# Patient Record
Sex: Male | Born: 1960 | Race: Black or African American | Hispanic: No | Marital: Married | Smoking: Never smoker
Health system: Southern US, Community
[De-identification: ages and names within clinical notes are randomized; demographics above are authoritative.]

## PROBLEM LIST (undated history)

## (undated) DIAGNOSIS — B029 Zoster without complications: Secondary | ICD-10-CM

## (undated) DIAGNOSIS — R0981 Nasal congestion: Secondary | ICD-10-CM

## (undated) DIAGNOSIS — T7840XA Allergy, unspecified, initial encounter: Secondary | ICD-10-CM

## (undated) HISTORY — PX: NO PAST SURGERIES: SHX2092

## (undated) HISTORY — DX: Allergy, unspecified, initial encounter: T78.40XA

---

## 2008-09-22 ENCOUNTER — Emergency Department (HOSPITAL_COMMUNITY): Admission: EM | Admit: 2008-09-22 | Discharge: 2008-09-22 | Payer: Self-pay | Admitting: Emergency Medicine

## 2009-03-10 ENCOUNTER — Emergency Department (HOSPITAL_COMMUNITY): Admission: EM | Admit: 2009-03-10 | Discharge: 2009-03-10 | Payer: Self-pay | Admitting: Emergency Medicine

## 2009-05-17 ENCOUNTER — Emergency Department (HOSPITAL_COMMUNITY): Admission: EM | Admit: 2009-05-17 | Discharge: 2009-05-17 | Payer: Self-pay | Admitting: Internal Medicine

## 2009-05-20 ENCOUNTER — Emergency Department (HOSPITAL_COMMUNITY): Admission: EM | Admit: 2009-05-20 | Discharge: 2009-05-20 | Payer: Self-pay | Admitting: Emergency Medicine

## 2009-06-02 ENCOUNTER — Emergency Department (HOSPITAL_COMMUNITY): Admission: EM | Admit: 2009-06-02 | Discharge: 2009-06-02 | Payer: Self-pay | Admitting: Emergency Medicine

## 2009-08-22 ENCOUNTER — Emergency Department (HOSPITAL_COMMUNITY): Admission: EM | Admit: 2009-08-22 | Discharge: 2009-08-22 | Payer: Self-pay | Admitting: Emergency Medicine

## 2009-12-23 ENCOUNTER — Emergency Department (HOSPITAL_COMMUNITY): Admission: EM | Admit: 2009-12-23 | Discharge: 2009-12-23 | Payer: Self-pay | Admitting: Emergency Medicine

## 2010-02-19 ENCOUNTER — Emergency Department: Admission: EM | Admit: 2010-02-19 | Discharge: 2010-02-19 | Payer: Self-pay | Admitting: Emergency Medicine

## 2010-11-29 ENCOUNTER — Emergency Department (HOSPITAL_COMMUNITY)
Admission: EM | Admit: 2010-11-29 | Discharge: 2010-11-29 | Payer: Self-pay | Source: Home / Self Care | Admitting: Emergency Medicine

## 2011-03-09 LAB — URINALYSIS, ROUTINE W REFLEX MICROSCOPIC
Hgb urine dipstick: NEGATIVE
Ketones, ur: NEGATIVE mg/dL
Specific Gravity, Urine: 1.019 (ref 1.005–1.030)

## 2011-06-19 ENCOUNTER — Emergency Department (HOSPITAL_COMMUNITY)
Admission: EM | Admit: 2011-06-19 | Discharge: 2011-06-19 | Disposition: A | Discharge status: Home / Self Care | Payer: Self-pay | Attending: Emergency Medicine | Admitting: Emergency Medicine

## 2011-06-19 DIAGNOSIS — J329 Chronic sinusitis, unspecified: Secondary | ICD-10-CM | POA: Insufficient documentation

## 2011-06-19 DIAGNOSIS — J3489 Other specified disorders of nose and nasal sinuses: Secondary | ICD-10-CM | POA: Insufficient documentation

## 2011-10-31 ENCOUNTER — Emergency Department (HOSPITAL_COMMUNITY)
Admission: EM | Admit: 2011-10-31 | Discharge: 2011-10-31 | Disposition: A | Discharge status: Home / Self Care | Payer: Self-pay | Attending: Emergency Medicine | Admitting: Emergency Medicine

## 2011-10-31 DIAGNOSIS — J329 Chronic sinusitis, unspecified: Secondary | ICD-10-CM | POA: Insufficient documentation

## 2011-10-31 DIAGNOSIS — J309 Allergic rhinitis, unspecified: Secondary | ICD-10-CM | POA: Insufficient documentation

## 2011-10-31 DIAGNOSIS — J302 Other seasonal allergic rhinitis: Secondary | ICD-10-CM | POA: Diagnosis present

## 2011-10-31 HISTORY — DX: Nasal congestion: R09.81

## 2011-10-31 MED ORDER — AMOXICILLIN 500 MG PO CAPS: 1000.0000 mg | ORAL_CAPSULE | Freq: Three times a day (TID) | ORAL | Status: AC

## 2011-10-31 MED ORDER — LORATADINE 10 MG PO TABS: 10.0000 mg | ORAL_TABLET | Freq: Every day | ORAL | Status: DC

## 2011-10-31 NOTE — ED Provider Notes (Signed)
History     CSN: [id number] Arrival date & time: 10/31/2011  7:58 PM   First MD Initiated Contact with Patient 10/31/11 2309      Chief Complaint  Patient presents with  . Nasal Congestion  . Sinusitis    (Consider location/radiation/quality/duration/timing/severity/associated sxs/prior treatment) HPI Comments: Patient reports 4 weeks of nasal congestion, postanasla drip, rhinorrhea, brown discharge, cough, sinus pain and headache.  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  Denies known fevers.  Has been taking over the counter allergy medication without relief.    The history is provided by the patient.    Past Medical History  Diagnosis Date  . Sinus congestion     No past surgical history on file.  History reviewed. No pertinent family history.  History  Substance Use Topics  . Smoking status: Never Smoker   . Smokeless tobacco: Not on file  . Alcohol Use: No      Review of Systems  All other systems reviewed and are negative.    Allergies  Review of patient's allergies indicates no known allergies.  Home Medications  No current outpatient prescriptions on file.  BP 145/95  Pulse 67  Temp(Src) 98.9 F (37.2 C) (Oral)  Resp 20  Ht 5\' 7"  (1.702 m)  Wt 190 lb (86.183 kg)  BMI 29.76 kg/m2  SpO2 100%  Physical Exam  Nursing note and vitals reviewed. Constitutional: He is oriented to person, place, and time. He appears well-developed and well-nourished.  HENT:  Head: Normocephalic and atraumatic.  Right Ear: Tympanic membrane is scarred. Tympanic membrane is not erythematous, not retracted and not bulging.  Left Ear: External ear normal.  Nose: Mucosal edema and rhinorrhea present. Right sinus exhibits maxillary sinus tenderness. Right sinus exhibits no frontal sinus tenderness. Left sinus exhibits maxillary sinus tenderness. Left sinus exhibits no frontal sinus tenderness.  Mouth/Throat:  Uvula is midline and oropharynx is clear and moist. No uvula swelling. No oropharyngeal exudate, posterior oropharyngeal edema or tonsillar abscesses.  Neck: Trachea normal, normal range of motion and phonation normal. Neck supple. No tracheal tenderness present. No rigidity. No tracheal deviation and normal range of motion present.       Supple, full AROM  Cardiovascular: Normal rate and regular rhythm.   Pulmonary/Chest: Effort normal and breath sounds normal. No stridor. He has no wheezes.  Neurological: He is alert and oriented to person, place, and time.    ED Course  Procedures (including critical care time)  Labs Reviewed - No data to display No results found.   1. Sinusitis   2. Seasonal allergies       MDM  Patient with environmental allergies with recurrent sinus infections, sinus pain, brown discharge, tender maxillary sinuses.         Dillard Cannon [place], [place] 10/31/11 2333

## 2011-10-31 NOTE — ED Notes (Signed)
Pt states he has had sinus drainage, bilateral ear pain, and sore throat for the past month. States that he has taken OTC allergy medicine with no relief. Pt states that his head hurts and ears pop when he coughs. Pt reports nonproductive cough at this time. Pt denies fever, nausea, vomiting.

## 2011-11-01 NOTE — ED Provider Notes (Signed)
Medical screening examination/treatment/procedure(s) were performed by non-physician practitioner and as supervising physician I was immediately available for consultation/collaboration.  Olivia Mackie, MD 11/01/11 [phone]

## 2012-01-28 ENCOUNTER — Emergency Department (HOSPITAL_COMMUNITY)
Admission: EM | Admit: 2012-01-28 | Discharge: 2012-01-28 | Disposition: A | Discharge status: Home / Self Care | Payer: Self-pay | Attending: Emergency Medicine | Admitting: Emergency Medicine

## 2012-01-28 ENCOUNTER — Encounter (HOSPITAL_COMMUNITY): Payer: Self-pay | Admitting: *Deleted

## 2012-01-28 DIAGNOSIS — J019 Acute sinusitis, unspecified: Secondary | ICD-10-CM | POA: Insufficient documentation

## 2012-01-28 DIAGNOSIS — R059 Cough, unspecified: Secondary | ICD-10-CM | POA: Insufficient documentation

## 2012-01-28 DIAGNOSIS — J069 Acute upper respiratory infection, unspecified: Secondary | ICD-10-CM | POA: Insufficient documentation

## 2012-01-28 DIAGNOSIS — J3489 Other specified disorders of nose and nasal sinuses: Secondary | ICD-10-CM | POA: Insufficient documentation

## 2012-01-28 DIAGNOSIS — R0982 Postnasal drip: Secondary | ICD-10-CM | POA: Insufficient documentation

## 2012-01-28 DIAGNOSIS — R220 Localized swelling, mass and lump, head: Secondary | ICD-10-CM | POA: Insufficient documentation

## 2012-01-28 DIAGNOSIS — R05 Cough: Secondary | ICD-10-CM | POA: Insufficient documentation

## 2012-01-28 MED ORDER — AZITHROMYCIN 250 MG PO TABS: 250.0000 mg | ORAL_TABLET | Freq: Every day | ORAL | Status: AC

## 2012-01-28 NOTE — ED Notes (Signed)
Pt states he has had a cold for 3 weeks. Pt states he is now having a productive cough and nasal drainage. Pt denies any chest pain . 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

## 2012-01-28 NOTE — Progress Notes (Signed)
Pt confirms no pcp self pay [place] resident CM offered number for [facility] and other local self pay pcps plus other [place] resources.  Pt appreciative of resources offered. Voiced understanding

## 2012-01-28 NOTE — ED Provider Notes (Signed)
History     CSN: [id number]  Arrival date & time 01/28/12  1652   First MD Initiated Contact with Patient 01/28/12 1730      Chief Complaint  Patient presents with  . URI  . Sinusitis    (Consider location/radiation/quality/duration/timing/severity/associated sxs/prior treatment) Patient is a 51 y.o. male presenting with sinusitis. The history is provided by the patient.  Sinusitis  This is a new problem. Episode onset: three weeks. The problem has not changed since onset.There has been no fever. Associated symptoms include congestion, sinus pressure and cough. Pertinent negatives include no chills, no ear pain, no hoarse voice, no sore throat and no shortness of breath.    Past Medical History  Diagnosis Date  . Sinus congestion     History reviewed. No pertinent past surgical history.  No family history on file.  History  Substance Use Topics  . Smoking status: Never Smoker   . Smokeless tobacco: Not on file  . Alcohol Use: No      Review of Systems  Constitutional: Negative for fever and chills.  HENT: Positive for congestion, postnasal drip and sinus pressure. Negative for ear pain, sore throat, hoarse voice, neck pain and neck stiffness.   Respiratory: Positive for cough. Negative for chest tightness and shortness of breath.   Cardiovascular: Negative for chest pain.  Gastrointestinal: Negative for nausea and vomiting.  Neurological: Negative for dizziness, syncope and light-headedness.    Allergies  Review of patient's allergies indicates no known allergies.  Home Medications   Current Outpatient Rx  Name Route Sig Dispense Refill  . OVER THE COUNTER MEDICATION Oral Take 1-2 tablets by mouth daily. OTC Severe Allergy medication from [facility].    Marland Kitchen OVER THE COUNTER MEDICATION Nasal Place 1 spray into the nose 2 (two) times daily. OTC nasal allergy spray from [facility].      BP 125/90  Pulse 77  Temp(Src) 98.7 F (37.1 C) (Oral)  Resp 22  Ht 5\' 7"   (1.702 m)  Wt 189 lb (85.73 kg)  BMI 29.60 kg/m2  SpO2 100%  Physical Exam  Nursing note and vitals reviewed. Constitutional: He is oriented to person, place, and time. He appears well-developed and well-nourished. No distress.  HENT:  Head: Normocephalic and atraumatic.  Right Ear: Tympanic membrane and ear canal normal.  Left Ear: Tympanic membrane and ear canal normal.  Nose: Mucosal edema and rhinorrhea present. Right sinus exhibits frontal sinus tenderness. Right sinus exhibits no maxillary sinus tenderness. Left sinus exhibits frontal sinus tenderness. Left sinus exhibits no maxillary sinus tenderness.  Mouth/Throat: Uvula is midline, oropharynx is clear and moist and mucous membranes are normal.  Neck: Normal range of motion. Neck supple.  Cardiovascular: Normal rate, regular rhythm and normal heart sounds.   Pulmonary/Chest: Effort normal and breath sounds normal. No respiratory distress. He has no wheezes. He has no rales. He exhibits no tenderness.  Musculoskeletal: Normal range of motion.  Neurological: He is alert and oriented to person, place, and time.  Skin: Skin is warm and dry. He is not diaphoretic.  Psychiatric: He has a normal mood and affect.    ED Course  Procedures (including critical care time)  Labs Reviewed - No data to display No results found.   No diagnosis found.    MDM  Signs and symptoms consistent with Acute Sinusitis.  Patient given Rx for antibiotic and instructed to use OTC decongestant.          Pascal Lux Enoch, PA-C 01/29/12 [phone]

## 2012-01-28 NOTE — Discharge Instructions (Signed)
Read the instructions below on reasons to return to the emergency department and to learn more about your diagnosis.  Use over the counter medications for symptomatic relief as we discussed (mucinex as a decongestant, Tylenol for fever/pain, Motrin/Ibuprofen for muscle aches). Followup with your primary care doctor in 4 days if your symptoms persist.  Your more than welcome to return to the emergency department if symptoms worsen or become concerning. ° °Upper Respiratory Infection, Adult  °An upper respiratory infection (URI) is also sometimes known as the common cold. Most people improve within 1 week, but symptoms can last up to 2 weeks. A residual cough may last even longer.  ° °URI is most commonly caused by a virus. Viruses are NOT treated with antibiotics. You can easily spread the virus to others by oral contact. This includes kissing, sharing a glass, coughing, or sneezing. Touching your mouth or nose and then touching a surface, which is then touched by another person, can also spread the virus.  ° °TREATMENT  °Treatment is directed at relieving symptoms. There is no cure. Antibiotics are not effective, because the infection is caused by a virus, not by bacteria. Treatment may include:  °Increased fluid intake. Sports drinks offer valuable electrolytes, sugars, and fluids.  °Breathing heated mist or steam (vaporizer or shower).  °Eating chicken soup or other clear broths, and maintaining good nutrition.  °Getting plenty of rest.  °Using gargles or lozenges for comfort.  °Controlling fevers with ibuprofen or acetaminophen as directed by your caregiver.  °Increasing usage of your inhaler if you have asthma.  °Return to work when your temperature has returned to normal.  ° °SEEK MEDICAL CARE IF:  °After the first few days, you feel you are getting worse rather than better.  °You develop worsening shortness of breath, or brown or red sputum. These may be signs of pneumonia.  °You develop yellow or brown nasal  discharge or pain in the face, especially when you bend forward. These may be signs of sinusitis.  °You develop a fever, swollen neck glands, pain with swallowing, or white areas in the back of your throat. These may be signs of strep throat.  ° °

## 2012-01-30 NOTE — ED Provider Notes (Signed)
Medical screening examination/treatment/procedure(s) were performed by non-physician practitioner and as supervising physician I was immediately available for consultation/collaboration. Maisie Hauser,Yohan Y.   Gavin Pound. Oletta Lamas, MD 01/30/12 [phone]

## 2012-09-25 ENCOUNTER — Emergency Department (HOSPITAL_COMMUNITY)
Admission: EM | Admit: 2012-09-25 | Discharge: 2012-09-25 | Disposition: A | Discharge status: Home / Self Care | Payer: Self-pay | Attending: Emergency Medicine | Admitting: Emergency Medicine

## 2012-09-25 ENCOUNTER — Emergency Department (HOSPITAL_COMMUNITY): Payer: Self-pay

## 2012-09-25 ENCOUNTER — Encounter (HOSPITAL_COMMUNITY): Payer: Self-pay | Admitting: *Deleted

## 2012-09-25 DIAGNOSIS — J329 Chronic sinusitis, unspecified: Secondary | ICD-10-CM | POA: Insufficient documentation

## 2012-09-25 DIAGNOSIS — R51 Headache: Secondary | ICD-10-CM | POA: Insufficient documentation

## 2012-09-25 DIAGNOSIS — Z79899 Other long term (current) drug therapy: Secondary | ICD-10-CM | POA: Insufficient documentation

## 2012-09-25 MED ORDER — AMOXICILLIN 500 MG PO CAPS: 500.0000 mg | ORAL_CAPSULE | Freq: Three times a day (TID) | ORAL | Status: DC

## 2012-09-25 MED ORDER — IBUPROFEN 800 MG PO TABS
800.0000 mg | ORAL_TABLET | Freq: Once | ORAL | Status: AC
  Administered 2012-09-25: 800 mg via ORAL
  Filled 2012-09-25: qty 1

## 2012-09-25 MED ORDER — FLUTICASONE PROPIONATE 50 MCG/ACT NA SUSP: 2.0000 | Freq: Every day | NASAL | Status: DC

## 2012-09-25 NOTE — Progress Notes (Signed)
Pt with no pcp nor coverage as confirmed by pt CM spoke with pt to review list of self pay pcps to further assist her with prescriptions and health care.  Discussed discounted pharmacies, DSS, health dept, needymeds.org and financial assistance programs in [place].  Pt voiced understanding and appreciation of resources and services offered 

## 2012-09-25 NOTE — ED Provider Notes (Signed)
Medical screening examination/treatment/procedure(s) were conducted as a shared visit with non-physician practitioner(s) and myself.  I personally evaluated the patient during the encounter  Donnetta Hutching, MD 09/25/12 2314

## 2012-09-25 NOTE — ED Provider Notes (Signed)
History     CSN: [id number]  Arrival date & time 09/25/12  1459   First MD Initiated Contact with Patient 09/25/12 1524      Chief Complaint  Patient presents with  . Nasal Congestion  . Headache    (Consider location/radiation/quality/duration/timing/severity/associated sxs/prior treatment) Patient is a 51 y.o. male presenting with URI. The history is provided by the patient.  URI The primary symptoms include fatigue, headaches, ear pain, sore throat, cough and myalgias. Primary symptoms do not include fever, swollen glands, abdominal pain, nausea, vomiting or rash. Episode onset: 3 weeks ago. This is a new problem. The problem has not changed since onset. The headache is not associated with neck stiffness.  Symptoms associated with the illness include chills, plugged ear sensation, facial pain, sinus pressure and congestion.  Pt states feels like prior sinus infection. States also having cough with thick white sputum. States tired equate brand congestion medication with no relief. Denies any fever, neck pain or stiffness, eye pain or pressure, chest pain, swelling of extremities. No PCP  Past Medical History  Diagnosis Date  . Sinus congestion     History reviewed. No pertinent past surgical history.  No family history on file.  History  Substance Use Topics  . Smoking status: Never Smoker   . Smokeless tobacco: Not on file  . Alcohol Use: No      Review of Systems  Constitutional: Positive for chills and fatigue. Negative for fever.  HENT: Positive for ear pain, congestion, sore throat and sinus pressure. Negative for neck pain, neck stiffness and dental problem.   Respiratory: Positive for cough. Negative for chest tightness.   Cardiovascular: Negative for chest pain, palpitations and leg swelling.  Gastrointestinal: Negative for nausea, vomiting and abdominal pain.  Musculoskeletal: Positive for myalgias.  Skin: Negative for rash.  Neurological: Positive for  headaches.  Hematological: Negative for adenopathy.    Allergies  Review of patient's allergies indicates no known allergies.  Home Medications   Current Outpatient Rx  Name Route Sig Dispense Refill  . ADULT MULTIVITAMIN W/MINERALS CH Oral Take 1 tablet by mouth daily.    Marland Kitchen OVER THE COUNTER MEDICATION Oral Take 1-2 tablets by mouth daily. OTC Severe Allergy medication from [facility].      BP 127/85  Pulse 80  Temp 98.5 F (36.9 C) (Oral)  Resp 16  Ht 5\' 7"  (1.702 m)  Wt 186 lb (84.369 kg)  BMI 29.13 kg/m2  SpO2 99%  Physical Exam  Nursing note and vitals reviewed. Constitutional: He is oriented to person, place, and time. He appears well-developed and well-nourished. No distress.  HENT:  Head: Normocephalic and atraumatic.  Right Ear: Tympanic membrane, external ear and ear canal normal.  Left Ear: Tympanic membrane, external ear and ear canal normal.  Nose: Rhinorrhea present. Right sinus exhibits maxillary sinus tenderness and frontal sinus tenderness. Left sinus exhibits maxillary sinus tenderness and frontal sinus tenderness.  Mouth/Throat: Uvula is midline, oropharynx is clear and moist and mucous membranes are normal.  Eyes: Conjunctivae normal are normal.  Neck: Neck supple.  Cardiovascular: Normal rate, regular rhythm and normal heart sounds.   Pulmonary/Chest: Effort normal and breath sounds normal. No respiratory distress. He has no wheezes. He has no rales.  Musculoskeletal: He exhibits no edema.  Neurological: He is alert and oriented to person, place, and time.  Psychiatric: He has a normal mood and affect.    ED Course  Procedures (including critical care time)  Labs Reviewed - No data  to display Dg Chest 2 View  09/25/2012  *RADIOLOGY REPORT*  Clinical Data: Cough, nasal congestion, headache  CHEST - 2 VIEW  Comparison: None  Findings: Normal heart size. Upper normal size of central pulmonary arteries, stable. Tortuous aorta. Lungs clear. No pleural  effusion or pneumothorax. Bones unremarkable.  IMPRESSION: No acute abnormalities.   Original Report Authenticated By: Lollie Marrow, M.D.    Negative chest. Pt has had sinus symptoms for 3 wks, exam consistent with sinusitis. Will start on antibiotic, decongestants, flonse. Follow up resources given.   Filed Vitals:   09/25/12 1511  BP: 127/85  Pulse: 80  Temp: 98.5 F (36.9 C)  Resp: 16    1. Sinusitis       MDM          Lottie Mussel, PA 09/25/12 1640

## 2012-09-25 NOTE — ED Notes (Signed)
Pt reports congestion, productive cough of clear sputum, ear pressure, head pressure. States that he has taken otc meds for symptoms with no relief. Pt is afebrile at this time and no cough is present.

## 2013-08-23 ENCOUNTER — Emergency Department (HOSPITAL_COMMUNITY)
Admission: EM | Admit: 2013-08-23 | Discharge: 2013-08-23 | Disposition: A | Discharge status: Home / Self Care | Payer: Self-pay | Attending: Emergency Medicine | Admitting: Emergency Medicine

## 2013-08-23 ENCOUNTER — Encounter (HOSPITAL_COMMUNITY): Payer: Self-pay | Admitting: Emergency Medicine

## 2013-08-23 DIAGNOSIS — H5710 Ocular pain, unspecified eye: Secondary | ICD-10-CM | POA: Insufficient documentation

## 2013-08-23 DIAGNOSIS — J302 Other seasonal allergic rhinitis: Secondary | ICD-10-CM

## 2013-08-23 DIAGNOSIS — J329 Chronic sinusitis, unspecified: Secondary | ICD-10-CM

## 2013-08-23 DIAGNOSIS — J309 Allergic rhinitis, unspecified: Secondary | ICD-10-CM | POA: Insufficient documentation

## 2013-08-23 DIAGNOSIS — H9209 Otalgia, unspecified ear: Secondary | ICD-10-CM | POA: Insufficient documentation

## 2013-08-23 MED ORDER — PENICILLIN V POTASSIUM 500 MG PO TABS
500.0000 mg | ORAL_TABLET | Freq: Once | ORAL | Status: AC
  Administered 2013-08-23: 500 mg via ORAL
  Filled 2013-08-23: qty 1

## 2013-08-23 MED ORDER — IBUPROFEN 800 MG PO TABS
800.0000 mg | ORAL_TABLET | Freq: Once | ORAL | Status: AC
  Administered 2013-08-23: 800 mg via ORAL
  Filled 2013-08-23: qty 1

## 2013-08-23 MED ORDER — PENICILLIN V POTASSIUM 500 MG PO TABS: 500.0000 mg | ORAL_TABLET | Freq: Three times a day (TID) | ORAL | Status: DC

## 2013-08-23 NOTE — ED Provider Notes (Signed)
Medical screening examination/treatment/procedure(s) were performed by non-physician practitioner and as supervising physician I was immediately available for consultation/collaboration.   Charles B. Bernette Mayers, MD 08/23/13 [phone]

## 2013-08-23 NOTE — ED Provider Notes (Signed)
CSN: [id number]     Arrival date & time 08/23/13  1716 History  This chart was scribed for Marlon Pel, PA, working with Leonette Most B. Bernette Mayers, MD by Blanchard Kelch, ED Scribe. This patient was seen in room WTR6/WTR6 and the patient's care was started at 6:20 PM.    Chief Complaint  Patient presents with  . Allergies    The history is provided by the patient. No language interpreter was used.    HPI Comments: KEYSHUN ELPERS is a 52 y.o. male with a history of seasonal allergies who presents to the Emergency Department complaining of constant congestion that began about a month ago. He has associated eye pain described as burning, ear pain, sinus pressure and headache. He has been taking OTC medications for his allergies without relief. He denies cough, sneezing or wheezing. He denies being allergic to any antibiotics that he knows of.  Past Medical History  Diagnosis Date  . Sinus congestion    History reviewed. No pertinent past surgical history. No family history on file. History  Substance Use Topics  . Smoking status: Never Smoker   . Smokeless tobacco: Not on file  . Alcohol Use: No    Review of Systems  HENT: Positive for ear pain, congestion and sinus pressure. Negative for sneezing.   Eyes: Positive for pain.  Respiratory: Negative for cough.   Allergic/Immunologic: Positive for environmental allergies.  Neurological: Positive for headaches.  All other systems reviewed and are negative.    Allergies  Review of patient's allergies indicates no known allergies.  Home Medications   Current Outpatient Rx  Name  Route  Sig  Dispense  Refill  . diphenhydrAMINE (BENADRYL) 25 MG tablet   Oral   Take 25 mg by mouth every 6 (six) hours as needed for itching or allergies.         Marland Kitchen penicillin v potassium (VEETID) 500 MG tablet   Oral   Take 1 tablet (500 mg total) by mouth 3 (three) times daily.   30 tablet   0    Triage Vitals: BP 141/102  Pulse 90  Temp(Src)  98.7 F (37.1 C)  Resp 18  SpO2 100%  Physical Exam  Nursing note and vitals reviewed. Constitutional: He is oriented to person, place, and time. He appears well-developed and well-nourished. No distress.  HENT:  Head: Normocephalic and atraumatic.  Right Ear: Tympanic membrane and ear canal normal.  Left Ear: Tympanic membrane and ear canal normal.  Nose: Rhinorrhea and sinus tenderness present. Right sinus exhibits maxillary sinus tenderness and frontal sinus tenderness. Left sinus exhibits maxillary sinus tenderness and frontal sinus tenderness.  Mouth/Throat: Uvula is midline and oropharynx is clear and moist.  Eyes: EOM are normal.  Neck: Neck supple. No tracheal deviation present.  Cardiovascular: Normal rate.   Pulmonary/Chest: Effort normal. No respiratory distress.  Musculoskeletal: Normal range of motion.  Neurological: He is alert and oriented to person, place, and time.  Skin: Skin is warm and dry.  Psychiatric: He has a normal mood and affect. His behavior is normal.    ED Course  Procedures (including critical care time)  DIAGNOSTIC STUDIES: Oxygen Saturation is 100% on room air, normal by my interpretation.    COORDINATION OF CARE:  6:24 PM -Suspect infection due to congestion. Will prescribe Penicillin. Recommend taking Alieve for the headache. Patient verbalizes understanding and agrees with treatment plan.   Labs Review Labs Reviewed - No data to display Imaging Review No results found.  MDM  1. Sinusitis   2. Seasonal allergies     51 y.o.Veverly Fells Janusz's evaluation in the Emergency Department is complete. It has been determined that no acute conditions requiring further emergency intervention are present at this time. The patient/guardian have been advised of the diagnosis and plan. We have discussed signs and symptoms that warrant return to the ED, such as changes or worsening in symptoms.  Vital signs are stable at discharge. Filed Vitals:    08/23/13 1733  BP: 141/102  Pulse: 90  Temp: 98.7 F (37.1 C)  Resp: 18    Patient/guardian has voiced understanding and agreed to follow-up with the PCP or specialist.  I personally performed the services described in this documentation, which was scribed in my presence. The recorded information has been reviewed and is accurate.    Dorthula Matas, PA-C 08/23/13 1830

## 2013-08-23 NOTE — ED Notes (Signed)
Per pt, has been having seasonal allergies for over a month, no relief with OTC meds

## 2013-09-18 ENCOUNTER — Ambulatory Visit: Payer: Self-pay | Admitting: Internal Medicine

## 2013-09-18 VITALS — BP 130/80 | HR 77 | Temp 97.9°F | Resp 16 | Ht 68.0 in | Wt 186.0 lb

## 2013-09-18 DIAGNOSIS — Z0289 Encounter for other administrative examinations: Secondary | ICD-10-CM

## 2013-09-18 NOTE — Progress Notes (Signed)
U/A Sp. Gr. 1.005  Protein- neg  Blood- Neg  Glucose- Neg

## 2013-09-18 NOTE — Progress Notes (Signed)
°  Subjective:    Patient ID: Ricardo [name], male    DOB: 08-03-1961, 52 y.o.   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 Patient says that overall he has been healthy this year. He has seasonal allergies and takes an OTC medication for them; otherwise he takes no other medications. Doesn't have a primary physician, he normally just goes to an urgent care or ER.    Review of Systems     Objective:   Physical Exam        Assessment & Plan:

## 2013-09-18 NOTE — Progress Notes (Signed)
  Subjective:    Patient ID: Ricardo [name], male    DOB: 02/17/1961, 52 y.o.   MRN: [id number]  HPI Self pay dot. No health problems   Review of Systems  Constitutional: Negative.   HENT: Negative.   Eyes: Negative.   Respiratory: Negative.   Cardiovascular: Negative.   Gastrointestinal: Negative.   Endocrine: Negative.   Genitourinary: Negative.   Musculoskeletal: Negative.   Skin: Positive for rash.  Allergic/Immunologic: Positive for environmental allergies and food allergies.  Neurological: Negative.   Hematological: Negative.   Psychiatric/Behavioral: Negative.        Objective:   Physical Exam  Vitals reviewed. Constitutional: He is oriented to person, place, and time. He appears well-developed and well-nourished.  HENT:  Right Ear: External ear normal.  Left Ear: External ear normal.  Nose: Nose normal.  Mouth/Throat: Oropharynx is clear and moist.  Eyes: Conjunctivae and EOM are normal. Pupils are equal, round, and reactive to light.  Neck: Normal range of motion. Neck supple. No tracheal deviation present. No thyromegaly present.  Cardiovascular: Normal rate, regular rhythm and normal heart sounds.   Pulmonary/Chest: Effort normal and breath sounds normal.  Abdominal: Soft. Bowel sounds are normal. He exhibits no mass. There is no tenderness.  Genitourinary: Penis normal.  Musculoskeletal: Normal range of motion.  Neurological: He is alert and oriented to person, place, and time. He has normal reflexes. No cranial nerve deficit. He exhibits normal muscle tone. Coordination normal.  Skin: Rash noted.  Psychiatric: He has a normal mood and affect. His behavior is normal. Thought content normal.          Assessment & Plan:  DOT 2 year

## 2013-09-18 NOTE — Patient Instructions (Signed)
Food Allergy A food allergy occurs from eating something you are sensitive to. Food allergies occur in all age groups. It may be passed to you from your parents (heredity).  CAUSES  Some common causes are cow's milk, seafood, eggs, nuts (including peanut butter), wheat, and soybeans. SYMPTOMS  Common problems are:   Swelling around the mouth.  An itchy, red rash.  Hives.  Vomiting.  Diarrhea. Severe allergic reactions are life-threatening. This reaction is called anaphylaxis. It can cause the mouth and throat to swell. This makes it hard to breathe and swallow. In severe reactions, only a small amount of food may be fatal within seconds. HOME CARE INSTRUCTIONS   If you are unsure what caused the reaction, keep a diary of foods eaten and symptoms that followed. Avoid foods that cause reactions.  If hives or rash are present:  Take medicines as directed.  Use an over-the-counter antihistamine (diphenhydramine) to treat hives and itching as needed.  Apply cold compresses to the skin or take baths in cool water. Avoid hot baths or showers. These will increase the redness and itching.  If you are severely allergic:  Hospitalization is often required following a severe reaction.  Wear a medical alert bracelet or necklace that describes the allergy.  Carry your anaphylaxis kit or epinephrine injection with you at all times. Both you and your family members should know how to use this. This can be lifesaving if you have a severe reaction. If epinephrine is used, it is important for you to seek immediate medical care or call your local emergency services (911 in [place] When the epinephrine wears off, it can be followed by a delayed reaction, which can be fatal.  Replace your epinephrine immediately after use in case of another reaction.  Ask your caregiver for instructions if you have not been taught how to use an epinephrine injection.  Do not drive until medicines used to treat the  reaction have worn off, unless approved by your caregiver. SEEK MEDICAL CARE IF:   You suspect a food allergy. Symptoms generally happen within 30 minutes of eating a food.  Your symptoms have not gone away within 2 days. See your caregiver sooner if symptoms are getting worse.  You develop new symptoms.  You want to retest yourself with a food or drink you think causes an allergic reaction. Never do this if an anaphylactic reaction to that food or drink has happened before.  There is a return of the symptoms which brought you to your caregiver. SEEK IMMEDIATE MEDICAL CARE IF:   You have trouble breathing, are wheezing, or you have a tight feeling in your chest or throat.  You have a swollen mouth, or you have hives, swelling, or itching all over your body. Use your epinephrine injection immediately. This is given into the outside of your thigh, deep into the muscle. Following use of the epinephrine injection, seek help right away. Seek immediate medical care or call your local emergency services (911 in [place] MAKE SURE YOU:   Understand these instructions.  Will watch your condition.  Will get help right away if you are not doing well or get worse. Document Released: 11/16/2000 Document Revised: 02/11/2012 Document Reviewed: 07/08/2008 [facility] Patient Information 2014 [place], [place]. Itching Itching is a symptom that can be caused by many things. These include skin problems (including infections) as well as some internal diseases.  If the itching is affecting just one area of the body, it is most likely due to a  common skin problem, such as:  Poison oak and poison ivy.  Contact dermatitis (skin irritation from a plant, chemicals, fiberglass, detergents, new cosmetic, new jewelry, or other substance).  Fungus (such as athlete's foot, jock itch, or ringworm).  Head lice  Dandruff  Insect bite  Infection (such as Shingles or other virus infections). If the itching is all  over (widespread), the possible causes are many. These include:   Dry skin or eczema  Heat rash  Hives  Liver disorders  Kidney disorders TREATMENT  Localized itching   Lubrication of the skin. Use an ointment or cream or other unperfumed moisturizers if the skin is dry. Apply frequently, especially after bathing.  Anti-itch medicines. These medications may help control the urge to scratch. Scratching always makes itching worse and increases the chance of getting an infection.  Cortisone creams and ointments. These help reduce the inflammation.  Antibiotics. Skin infections can cause itching. Topical or oral antibiotics may be needed for 10 to 20 days to get rid of an infection. If you can identify what caused the itching, avoid this substance in the future.  Widespread itching  The following measures may help to relieve itching regardless of the cause:   Wash the skin once with soap to remove irritants.  Bathe in tepid water with baking soda, cornstarch, or oatmeal.  Use calamine lotion (nonprescription) or a baking soda solution (1 teaspoon in 4 ounces of water on the skin).  Apply 1% hydrocortisone cream (no prescription needed). Do not use this if there might be a skin infection.  Avoid scratching.  Avoid itchy or tight-fitting clothes.  Avoid excessive heat, sweating, scented soaps, and swimming pools.  The lubricants, anti-itch medicines, etc. noted above may be helpful for controlling symptoms. SEEK MEDICAL CARE IF:   The itching becomes severe.  Your itch is not better after 1 week of treatment. Contact your caregiver to schedule further evaluation. Document Released: 11/19/2005 Document Revised: 02/11/2012 Document Reviewed: 05/09/2007 [facility] Patient Information 2014 [place], [place].

## 2013-09-22 ENCOUNTER — Encounter: Payer: Self-pay | Admitting: Internal Medicine

## 2014-04-20 ENCOUNTER — Emergency Department (HOSPITAL_COMMUNITY)
Admission: EM | Admit: 2014-04-20 | Discharge: 2014-04-20 | Disposition: A | Discharge status: Home / Self Care | Payer: Self-pay | Attending: Emergency Medicine | Admitting: Emergency Medicine

## 2014-04-20 ENCOUNTER — Encounter (HOSPITAL_COMMUNITY): Payer: Self-pay | Admitting: Emergency Medicine

## 2014-04-20 DIAGNOSIS — R05 Cough: Secondary | ICD-10-CM | POA: Insufficient documentation

## 2014-04-20 DIAGNOSIS — R059 Cough, unspecified: Secondary | ICD-10-CM | POA: Insufficient documentation

## 2014-04-20 DIAGNOSIS — J310 Chronic rhinitis: Secondary | ICD-10-CM

## 2014-04-20 DIAGNOSIS — J329 Chronic sinusitis, unspecified: Secondary | ICD-10-CM

## 2014-04-20 MED ORDER — FLUTICASONE PROPIONATE 50 MCG/ACT NA SUSP: 2.0000 | Freq: Every day | NASAL | Status: DC

## 2014-04-20 MED ORDER — AMOXICILLIN 500 MG PO CAPS: 500.0000 mg | ORAL_CAPSULE | Freq: Three times a day (TID) | ORAL | Status: DC

## 2014-04-20 NOTE — ED Notes (Signed)
Patient is from home with multiple complaints. Patient c/o earache, sinus pressure and H/A that started 3-4 weeks ago, which has been on and off. Patient states he was taking sever allergy [employer] brand, which he took last dose yesterday. Patient denies fever, chills, or SOB. He states he had been coughing up mucus that started yellowish but now clear.

## 2014-04-20 NOTE — ED Provider Notes (Signed)
Medical screening examination/treatment/procedure(s) were performed by non-physician practitioner and as supervising physician I was immediately available for consultation/collaboration.   EKG Interpretation None        Einer Meals E Arrielle Mcginn, MD 04/20/14 1919 

## 2014-04-20 NOTE — Discharge Instructions (Signed)
Take antibiotic as directed for the next 7 days. Use nasal spray daily as directed. It will also help to use nasal saline.  Sinusitis Sinusitis is redness, soreness, and swelling (inflammation) of the paranasal sinuses. Paranasal sinuses are air pockets within the bones of your face (beneath the eyes, the middle of the forehead, or above the eyes). In healthy paranasal sinuses, mucus is able to drain out, and air is able to circulate through them by way of your nose. However, when your paranasal sinuses are inflamed, mucus and air can become trapped. This can allow bacteria and other germs to grow and cause infection. Sinusitis can develop quickly and last only a short time (acute) or continue over a long period (chronic). Sinusitis that lasts for more than 12 weeks is considered chronic.  CAUSES  Causes of sinusitis include:  Allergies.  Structural abnormalities, such as displacement of the cartilage that separates your nostrils (deviated septum), which can decrease the air flow through your nose and sinuses and affect sinus drainage.  Functional abnormalities, such as when the small hairs (cilia) that line your sinuses and help remove mucus do not work properly or are not present. SYMPTOMS  Symptoms of acute and chronic sinusitis are the same. The primary symptoms are pain and pressure around the affected sinuses. Other symptoms include:  Upper toothache.  Earache.  Headache.  Bad breath.  Decreased sense of smell and taste.  A cough, which worsens when you are lying flat.  Fatigue.  Fever.  Thick drainage from your nose, which often is green and may contain pus (purulent).  Swelling and warmth over the affected sinuses. DIAGNOSIS  Your caregiver will perform a physical exam. During the exam, your caregiver may:  Look in your nose for signs of abnormal growths in your nostrils (nasal polyps).  Tap over the affected sinus to check for signs of infection.  View the inside of  your sinuses (endoscopy) with a special imaging device with a light attached (endoscope), which is inserted into your sinuses. If your caregiver suspects that you have chronic sinusitis, one or more of the following tests may be recommended:  Allergy tests.  Nasal culture A sample of mucus is taken from your nose and sent to a lab and screened for bacteria.  Nasal cytology A sample of mucus is taken from your nose and examined by your caregiver to determine if your sinusitis is related to an allergy. TREATMENT  Most cases of acute sinusitis are related to a viral infection and will resolve on their own within 10 days. Sometimes medicines are prescribed to help relieve symptoms (pain medicine, decongestants, nasal steroid sprays, or saline sprays).  However, for sinusitis related to a bacterial infection, your caregiver will prescribe antibiotic medicines. These are medicines that will help kill the bacteria causing the infection.  Rarely, sinusitis is caused by a fungal infection. In theses cases, your caregiver will prescribe antifungal medicine. For some cases of chronic sinusitis, surgery is needed. Generally, these are cases in which sinusitis recurs more than 3 times per year, despite other treatments. HOME CARE INSTRUCTIONS   Drink plenty of water. Water helps thin the mucus so your sinuses can drain more easily.  Use a humidifier.  Inhale steam 3 to 4 times a day (for example, sit in the bathroom with the shower running).  Apply a warm, moist washcloth to your face 3 to 4 times a day, or as directed by your caregiver.  Use saline nasal sprays to help moisten  and clean your sinuses.  Take over-the-counter or prescription medicines for pain, discomfort, or fever only as directed by your caregiver. SEEK IMMEDIATE MEDICAL CARE IF:  You have increasing pain or severe headaches.  You have nausea, vomiting, or drowsiness.  You have swelling around your face.  You have vision  problems.  You have a stiff neck.  You have difficulty breathing. MAKE SURE YOU:   Understand these instructions.  Will watch your condition.  Will get help right away if you are not doing well or get worse. Document Released: 11/19/2005 Document Revised: 02/11/2012 Document Reviewed: 12/04/2011 [facility] Patient Information 2014 [place].  Upper Respiratory Infection, Adult An upper respiratory infection (URI) is also sometimes known as the common cold. The upper respiratory tract includes the nose, sinuses, throat, trachea, and bronchi. Bronchi are the airways leading to the lungs. Most people improve within 1 week, but symptoms can last up to 2 weeks. A residual cough may last even longer.  CAUSES Many different viruses can infect the tissues lining the upper respiratory tract. The tissues become irritated and inflamed and often become very moist. Mucus production is also common. A cold is contagious. You can easily spread the virus to others by oral contact. This includes kissing, sharing a glass, coughing, or sneezing. Touching your mouth or nose and then touching a surface, which is then touched by another person, can also spread the virus. SYMPTOMS  Symptoms typically develop 1 to 3 days after you come in contact with a cold virus. Symptoms vary from person to person. They may include:  Runny nose.  Sneezing.  Nasal congestion.  Sinus irritation.  Sore throat.  Loss of voice (laryngitis).  Cough.  Fatigue.  Muscle aches.  Loss of appetite.  Headache.  Low-grade fever. DIAGNOSIS  You might diagnose your own cold based on familiar symptoms, since most people get a cold 2 to 3 times a year. Your caregiver can confirm this based on your exam. Most importantly, your caregiver can check that your symptoms are not due to another disease such as strep throat, sinusitis, pneumonia, asthma, or epiglottitis. Blood tests, throat tests, and X-rays are not necessary to  diagnose a common cold, but they may sometimes be helpful in excluding other more serious diseases. Your caregiver will decide if any further tests are required. RISKS AND COMPLICATIONS  You may be at risk for a more severe case of the common cold if you smoke cigarettes, have chronic heart disease (such as heart failure) or lung disease (such as asthma), or if you have a weakened immune system. The very young and very old are also at risk for more serious infections. Bacterial sinusitis, middle ear infections, and bacterial pneumonia can complicate the common cold. The common cold can worsen asthma and chronic obstructive pulmonary disease (COPD). Sometimes, these complications can require emergency medical care and may be life-threatening. PREVENTION  The best way to protect against getting a cold is to practice good hygiene. Avoid oral or hand contact with people with cold symptoms. Wash your hands often if contact occurs. There is no clear evidence that vitamin C, vitamin E, echinacea, or exercise reduces the chance of developing a cold. However, it is always recommended to get plenty of rest and practice good nutrition. TREATMENT  Treatment is directed at relieving symptoms. There is no cure. Antibiotics are not effective, because the infection is caused by a virus, not by bacteria. Treatment may include:  Increased fluid intake. Sports drinks offer valuable electrolytes,  sugars, and fluids.  Breathing heated mist or steam (vaporizer or shower).  Eating chicken soup or other clear broths, and maintaining good nutrition.  Getting plenty of rest.  Using gargles or lozenges for comfort.  Controlling fevers with ibuprofen or acetaminophen as directed by your caregiver.  Increasing usage of your inhaler if you have asthma. Zinc gel and zinc lozenges, taken in the first 24 hours of the common cold, can shorten the duration and lessen the severity of symptoms. Pain medicines may help with fever,  muscle aches, and throat pain. A variety of non-prescription medicines are available to treat congestion and runny nose. Your caregiver can make recommendations and may suggest nasal or lung inhalers for other symptoms.  HOME CARE INSTRUCTIONS   Only take over-the-counter or prescription medicines for pain, discomfort, or fever as directed by your caregiver.  Use a warm mist humidifier or inhale steam from a shower to increase air moisture. This may keep secretions moist and make it easier to breathe.  Drink enough water and fluids to keep your urine clear or pale yellow.  Rest as needed.  Return to work when your temperature has returned to normal or as your caregiver advises. You may need to stay home longer to avoid infecting others. You can also use a face mask and careful hand washing to prevent spread of the virus. SEEK MEDICAL CARE IF:   After the first few days, you feel you are getting worse rather than better.  You need your caregiver's advice about medicines to control symptoms.  You develop chills, worsening shortness of breath, or brown or red sputum. These may be signs of pneumonia.  You develop yellow or brown nasal discharge or pain in the face, especially when you bend forward. These may be signs of sinusitis.  You develop a fever, swollen neck glands, pain with swallowing, or white areas in the back of your throat. These may be signs of strep throat. SEEK IMMEDIATE MEDICAL CARE IF:   You have a fever.  You develop severe or persistent headache, ear pain, sinus pain, or chest pain.  You develop wheezing, a prolonged cough, cough up blood, or have a change in your usual mucus (if you have chronic lung disease).  You develop sore muscles or a stiff neck. Document Released: 05/15/2001 Document Revised: 02/11/2012 Document Reviewed: 03/23/2011 [facility] Patient Information 2014 [place], [place].

## 2014-04-20 NOTE — ED Provider Notes (Signed)
CSN: [id number]     Arrival date & time 04/20/14  1751 History  This chart was scribed for Ricardo Gourdobyn Albert, PA working with Suzi RootsKevin E Steinl, MD, by Bronson CurbJacqueline Melvin, ED Scribe. This patient was seen in room WTR5/WTR5 and the patient's care was started at 6:07 PM.    Chief Complaint  Patient presents with  . Sinus Problem  . Otalgia     The history is provided by the patient. No language interpreter was used.   HPI Comments: Virgel PalingMichael D Nienaber is a 53 y.o. male who presents to the Emergency Department complaining of sinus congestion that began 3-4 weeks ago. There is associated congestion, bilateral ear pain, cough productive of yellow sputum, and sinus pressure. Patient states he has taken OTC sinus medication with no relief. He denies fever, chills or SOB. Patient has medical history of sinus congestion and allergies.   Past Medical History  Diagnosis Date  . Sinus congestion   . Allergy    History reviewed. No pertinent past surgical history. Family History  Problem Relation Age of Onset  . Hyperlipidemia Father    History  Substance Use Topics  . Smoking status: Never Smoker   . Smokeless tobacco: Not on file  . Alcohol Use: No    Review of Systems  HENT: Positive for congestion, ear pain and sinus pressure.   Respiratory: Positive for cough.   Neurological: Positive for headaches.  All other systems reviewed and are negative.     Allergies  Review of patient's allergies indicates no known allergies.  Home Medications   Prior to Admission medications   Not on File   Triage Vitals: BP 143/85  Pulse 82  Temp(Src) 98.2 F (36.8 C) (Oral)  Resp 18  SpO2 98%  Physical Exam  Nursing note and vitals reviewed. Constitutional: He is oriented to person, place, and time. He appears well-developed and well-nourished. No distress.  HENT:  Head: Normocephalic and atraumatic.  Right Ear: External ear normal.  Left Ear: External ear normal.  Nose: Mucosal edema present.  Right sinus exhibits maxillary sinus tenderness and frontal sinus tenderness. Left sinus exhibits maxillary sinus tenderness and frontal sinus tenderness.  Nasal congestion. Post nasal drip.  Eyes: Conjunctivae and EOM are normal.  Neck: Normal range of motion. Neck supple.  Cardiovascular: Normal rate, regular rhythm and normal heart sounds.   Pulmonary/Chest: Effort normal and breath sounds normal.  Musculoskeletal: Normal range of motion. He exhibits no edema.  Lymphadenopathy:    He has no cervical adenopathy.  Neurological: He is alert and oriented to person, place, and time.  Skin: Skin is warm and dry.  Psychiatric: He has a normal mood and affect. His behavior is normal.    ED Course  Procedures (including critical care time)  DIAGNOSTIC STUDIES: Oxygen Saturation is 98% on room air, normal by my interpretation.    COORDINATION OF CARE: At 1818 Discussed treatment plan with patient which includes antibiotics and nasal spray. Patient agrees.   Labs Review Labs Reviewed - No data to display  Imaging Review No results found.   EKG Interpretation None      MDM   Final diagnoses:  Sinusitis  Rhinitis    Patient well-appearing and in no apparent distress. Afebrile, vital signs stable. Given patient's symptoms have been present for 3-4 weeks, will treat with antibiotics. Will also prescribe Flonase. Advised nasal saline. Resources given for PCP followup. Stable for discharge. Return precautions given. Patient states understanding of treatment care plan and is agreeable.  I personally performed the services described in this documentation, which was scribed in my presence. The recorded information has been reviewed and is accurate.    Trevor MaceRobyn M Albert, PA-C 04/20/14 [id number]

## 2014-09-14 ENCOUNTER — Emergency Department (HOSPITAL_COMMUNITY)
Admission: EM | Admit: 2014-09-14 | Discharge: 2014-09-14 | Disposition: A | Discharge status: Home / Self Care | Payer: Self-pay | Attending: Emergency Medicine | Admitting: Emergency Medicine

## 2014-09-14 ENCOUNTER — Encounter (HOSPITAL_COMMUNITY): Payer: Self-pay | Admitting: Emergency Medicine

## 2014-09-14 DIAGNOSIS — R109 Unspecified abdominal pain: Secondary | ICD-10-CM | POA: Insufficient documentation

## 2014-09-14 DIAGNOSIS — Z792 Long term (current) use of antibiotics: Secondary | ICD-10-CM | POA: Insufficient documentation

## 2014-09-14 DIAGNOSIS — T148XXA Other injury of unspecified body region, initial encounter: Secondary | ICD-10-CM

## 2014-09-14 DIAGNOSIS — Z7951 Long term (current) use of inhaled steroids: Secondary | ICD-10-CM | POA: Insufficient documentation

## 2014-09-14 DIAGNOSIS — J302 Other seasonal allergic rhinitis: Secondary | ICD-10-CM

## 2014-09-14 DIAGNOSIS — M791 Myalgia: Secondary | ICD-10-CM | POA: Insufficient documentation

## 2014-09-14 DIAGNOSIS — R0981 Nasal congestion: Secondary | ICD-10-CM | POA: Insufficient documentation

## 2014-09-14 DIAGNOSIS — J0141 Acute recurrent pansinusitis: Secondary | ICD-10-CM | POA: Insufficient documentation

## 2014-09-14 LAB — URINALYSIS, ROUTINE W REFLEX MICROSCOPIC
BILIRUBIN URINE: NEGATIVE
GLUCOSE, UA: NEGATIVE mg/dL
Hgb urine dipstick: NEGATIVE
Ketones, ur: NEGATIVE mg/dL
Leukocytes, UA: NEGATIVE
NITRITE: NEGATIVE
Protein, ur: NEGATIVE mg/dL
Specific Gravity, Urine: 1.025 (ref 1.005–1.030)
Urobilinogen, UA: 0.2 mg/dL (ref 0.0–1.0)
pH: 7.5 (ref 5.0–8.0)

## 2014-09-14 MED ORDER — TRAMADOL HCL 50 MG PO TABS: 50.0000 mg | ORAL_TABLET | Freq: Four times a day (QID) | ORAL | Status: DC | PRN

## 2014-09-14 MED ORDER — AMOXICILLIN-POT CLAVULANATE 875-125 MG PO TABS: 1.0000 | ORAL_TABLET | Freq: Two times a day (BID) | ORAL | Status: DC

## 2014-09-14 MED ORDER — LORATADINE 10 MG PO TABS: 10.0000 mg | ORAL_TABLET | Freq: Every day | ORAL | Status: DC

## 2014-09-14 MED ORDER — FLUTICASONE PROPIONATE 50 MCG/ACT NA SUSP: 2.0000 | Freq: Every day | NASAL | Status: DC

## 2014-09-14 MED ORDER — NAPROXEN 500 MG PO TABS: 500.0000 mg | ORAL_TABLET | Freq: Two times a day (BID) | ORAL | Status: DC | PRN

## 2014-09-14 NOTE — Discharge Instructions (Signed)
Continue to stay well-hydrated. Take all of the antibiotic prescribed to help with your sinus infection. Use flonase to help with congestion. Take claritin to help with seasonal allergies.  Continue to alternate between Tylenol and Naprosyn for pain or fever, and use ultram for breakthrough pain in your side but don't drive while taking this. Use heat over the area that is painful on your side. Followup with your primary care doctor in 5-7 days for recheck of ongoing symptoms; if you don't have a doctor, use the resource guide below to find one. Call the allergist listed above to make an appointment for ongoing management of your seasonal sinus infections. Return to emergency department for emergent changing or worsening of symptoms.   Sinusitis Sinusitis is redness, soreness, and puffiness (inflammation) of the air pockets in the bones of your face (sinuses). The redness, soreness, and puffiness can cause air and mucus to get trapped in your sinuses. This can allow germs to grow and cause an infection.  HOME CARE   Drink enough fluids to keep your pee (urine) clear or pale yellow.  Use a humidifier in your home.  Run a hot shower to create steam in the bathroom. Sit in the bathroom with the door closed. Breathe in the steam 3-4 times a day.  Put a warm, moist washcloth on your face 3-4 times a day, or as told by your doctor.  Use salt water sprays (saline sprays) to wet the thick fluid in your nose. This can help the sinuses drain.  Only take medicine as told by your doctor. GET HELP RIGHT AWAY IF:   Your pain gets worse.  You have very bad headaches.  You are sick to your stomach (nauseous).  You throw up (vomit).  You are very sleepy (drowsy) all the time.  Your face is puffy (swollen).  Your vision changes.  You have a stiff neck.  You have trouble breathing. MAKE SURE YOU:   Understand these instructions.  Will watch your condition.  Will get help right away if you are  not doing well or get worse. Document Released: 05/07/2008 Document Revised: 08/13/2012 Document Reviewed: 06/24/2012 [facility] Patient Information 2015 [place]. This information is not intended to replace advice given to you by your health care provider. Make sure you discuss any questions you have with your health care provider.  Upper Respiratory Infection, Adult An upper respiratory infection (URI) is also sometimes known as the common cold. The upper respiratory tract includes the nose, sinuses, throat, trachea, and bronchi. Bronchi are the airways leading to the lungs. Most people improve within 1 week, but symptoms can last up to 2 weeks. A residual cough may last even longer.  CAUSES Many different viruses can infect the tissues lining the upper respiratory tract. The tissues become irritated and inflamed and often become very moist. Mucus production is also common. A cold is contagious. You can easily spread the virus to others by oral contact. This includes kissing, sharing a glass, coughing, or sneezing. Touching your mouth or nose and then touching a surface, which is then touched by another person, can also spread the virus. SYMPTOMS  Symptoms typically develop 1 to 3 days after you come in contact with a cold virus. Symptoms vary from person to person. They may include:  Runny nose.  Sneezing.  Nasal congestion.  Sinus irritation.  Sore throat.  Loss of voice (laryngitis).  Cough.  Fatigue.  Muscle aches.  Loss of appetite.  Headache.  Low-grade fever.  DIAGNOSIS  You might diagnose your own cold based on familiar symptoms, since most people get a cold 2 to 3 times a year. Your caregiver can confirm this based on your exam. Most importantly, your caregiver can check that your symptoms are not due to another disease such as strep throat, sinusitis, pneumonia, asthma, or epiglottitis. Blood tests, throat tests, and X-rays are not necessary to diagnose a common  cold, but they may sometimes be helpful in excluding other more serious diseases. Your caregiver will decide if any further tests are required. RISKS AND COMPLICATIONS  You may be at risk for a more severe case of the common cold if you smoke cigarettes, have chronic heart disease (such as heart failure) or lung disease (such as asthma), or if you have a weakened immune system. The very young and very old are also at risk for more serious infections. Bacterial sinusitis, middle ear infections, and bacterial pneumonia can complicate the common cold. The common cold can worsen asthma and chronic obstructive pulmonary disease (COPD). Sometimes, these complications can require emergency medical care and may be life-threatening. PREVENTION  The best way to protect against getting a cold is to practice good hygiene. Avoid oral or hand contact with people with cold symptoms. Wash your hands often if contact occurs. There is no clear evidence that vitamin C, vitamin E, echinacea, or exercise reduces the chance of developing a cold. However, it is always recommended to get plenty of rest and practice good nutrition. TREATMENT  Treatment is directed at relieving symptoms. There is no cure. Antibiotics are not effective, because the infection is caused by a virus, not by bacteria. Treatment may include:  Increased fluid intake. Sports drinks offer valuable electrolytes, sugars, and fluids.  Breathing heated mist or steam (vaporizer or shower).  Eating chicken soup or other clear broths, and maintaining good nutrition.  Getting plenty of rest.  Using gargles or lozenges for comfort.  Controlling fevers with ibuprofen or acetaminophen as directed by your caregiver.  Increasing usage of your inhaler if you have asthma. Zinc gel and zinc lozenges, taken in the first 24 hours of the common cold, can shorten the duration and lessen the severity of symptoms. Pain medicines may help with fever, muscle aches, and  throat pain. A variety of non-prescription medicines are available to treat congestion and runny nose. Your caregiver can make recommendations and may suggest nasal or lung inhalers for other symptoms.  HOME CARE INSTRUCTIONS   Only take over-the-counter or prescription medicines for pain, discomfort, or fever as directed by your caregiver.  Use a warm mist humidifier or inhale steam from a shower to increase air moisture. This may keep secretions moist and make it easier to breathe.  Drink enough water and fluids to keep your urine clear or pale yellow.  Rest as needed.  Return to work when your temperature has returned to normal or as your caregiver advises. You may need to stay home longer to avoid infecting others. You can also use a face mask and careful hand washing to prevent spread of the virus. SEEK MEDICAL CARE IF:   After the first few days, you feel you are getting worse rather than better.  You need your caregiver's advice about medicines to control symptoms.  You develop chills, worsening shortness of breath, or brown or red sputum. These may be signs of pneumonia.  You develop yellow or brown nasal discharge or pain in the face, especially when you bend forward. These may be  signs of sinusitis.  You develop a fever, swollen neck glands, pain with swallowing, or white areas in the back of your throat. These may be signs of strep throat. SEEK IMMEDIATE MEDICAL CARE IF:   You have a fever.  You develop severe or persistent headache, ear pain, sinus pain, or chest pain.  You develop wheezing, a prolonged cough, cough up blood, or have a change in your usual mucus (if you have chronic lung disease).  You develop sore muscles or a stiff neck. Document Released: 05/15/2001 Document Revised: 02/11/2012 Document Reviewed: 02/24/2014 [facility] Patient Information 2015 [place], [place]. This information is not intended to replace advice given to you by your health care provider.  Make sure you discuss any questions you have with your health care provider.  Allergic Rhinitis Allergic rhinitis is when the mucous membranes in the nose respond to allergens. Allergens are particles in the air that cause your body to have an allergic reaction. This causes you to release allergic antibodies. Through a chain of events, these eventually cause you to release histamine into the blood stream. Although meant to protect the body, it is this release of histamine that causes your discomfort, such as frequent sneezing, congestion, and an itchy, runny nose.  CAUSES  Seasonal allergic rhinitis (hay fever) is caused by pollen allergens that may come from grasses, trees, and weeds. Year-round allergic rhinitis (perennial allergic rhinitis) is caused by allergens such as house dust mites, pet dander, and mold spores.  SYMPTOMS   Nasal stuffiness (congestion).  Itchy, runny nose with sneezing and tearing of the eyes. DIAGNOSIS  Your health care provider can help you determine the allergen or allergens that trigger your symptoms. If you and your health care provider are unable to determine the allergen, skin or blood testing may be used. TREATMENT  Allergic rhinitis does not have a cure, but it can be controlled by:  Medicines and allergy shots (immunotherapy).  Avoiding the allergen. Hay fever may often be treated with antihistamines in pill or nasal spray forms. Antihistamines block the effects of histamine. There are over-the-counter medicines that may help with nasal congestion and swelling around the eyes. Check with your health care provider before taking or giving this medicine.  If avoiding the allergen or the medicine prescribed do not work, there are many new medicines your health care provider can prescribe. Stronger medicine may be used if initial measures are ineffective. Desensitizing injections can be used if medicine and avoidance does not work. Desensitization is when a patient  is given ongoing shots until the body becomes less sensitive to the allergen. Make sure you follow up with your health care provider if problems continue. HOME CARE INSTRUCTIONS It is not possible to completely avoid allergens, but you can reduce your symptoms by taking steps to limit your exposure to them. It helps to know exactly what you are allergic to so that you can avoid your specific triggers. SEEK MEDICAL CARE IF:   You have a fever.  You develop a cough that does not stop easily (persistent).  You have shortness of breath.  You start wheezing.  Symptoms interfere with normal daily activities. Document Released: 08/14/2001 Document Revised: 11/24/2013 Document Reviewed: 07/27/2013 [facility] Patient Information 2015 [place], [place]. This information is not intended to replace advice given to you by your health care provider. Make sure you discuss any questions you have with your health care provider.  Muscle Strain A muscle strain (pulled muscle) happens when a muscle is stretched beyond  normal length. It happens when a sudden, violent force stretches your muscle too far. Usually, a few of the fibers in your muscle are torn. Muscle strain is common in athletes. Recovery usually takes 1-2 weeks. Complete healing takes 5-6 weeks.  HOME CARE   Follow the PRICE method of treatment to help your injury get better. Do this the first 2-3 days after the injury:  Protect. Protect the muscle to keep it from getting injured again.  Rest. Limit your activity and rest the injured body part.  Ice. Put ice in a plastic bag. Place a towel between your skin and the bag. Then, apply the ice and leave it on from 15-20 minutes each hour. After the third day, switch to moist heat packs.  Compression. Use a splint or elastic bandage on the injured area for comfort. Do not put it on too tightly.  Elevate. Keep the injured body part above the level of your heart.  Only take medicine as told by your  doctor.  Warm up before doing exercise to prevent future muscle strains. GET HELP IF:   You have more pain or puffiness (swelling) in the injured area.  You feel numbness, tingling, or notice a loss of strength in the injured area. MAKE SURE YOU:   Understand these instructions.  Will watch your condition.  Will get help right away if you are not doing well or get worse. Document Released: 08/28/2008 Document Revised: 09/09/2013 Document Reviewed: 06/18/2013 [facility] Patient Information 2015 [place], [place]. This information is not intended to replace advice given to you by your health care provider. Make sure you discuss any questions you have with your health care provider.  Emergency Department Resource Guide 1) Find a Doctor and Pay Out of Pocket Although you won't have to find out who is covered by your insurance plan, it is a good idea to ask around and get recommendations. You will then need to call the office and see if the doctor you have chosen will accept you as a new patient and what types of options they offer for patients who are self-pay. Some doctors offer discounts or will set up payment plans for their patients who do not have insurance, but you will need to ask so you aren't surprised when you get to your appointment.  2) Contact Your Local Health Department Not all health departments have doctors that can see patients for sick visits, but many do, so it is worth a call to see if yours does. If you don't know where your local health department is, you can check in your phone book. The CDC also has a tool to help you locate your state's health department, and many state websites also have listings of all of their local health departments.  3) Find a Walk-in Clinic If your illness is not likely to be very severe or complicated, you may want to try a walk in clinic. These are popping up all over the country in pharmacies, drugstores, and shopping centers. They're usually  staffed by nurse practitioners or physician assistants that have been trained to treat common illnesses and complaints. They're usually fairly quick and inexpensive. However, if you have serious medical issues or chronic medical problems, these are probably not your best option.  No Primary Care Doctor: - Call Health Connect at  [phone] - they can help you locate a primary care doctor that  accepts your insurance, provides certain services, etc. - Physician Referral Service- [phone]  Chronic Pain Problems: Organization  Address  Phone   Notes  [facility]  [phone] Patients need to be referred by their primary care doctor.   Medication Assistance: Organization         Address  Phone   Notes  [facility] [address] [place], [place] [address] [phone] --Must be a resident of [place] [place] -- Must have NO insurance coverage whatsoever (no Medicaid/ Medicare, etc.) -- The pt. MUST have a primary care doctor that directs their care regularly and follows them in the community   [facility]  [phone]   [employer]  [phone]    Agencies that provide inexpensive medical care: Organization         Address  Phone   Notes  [facility]  [phone]   [facility]    [phone]   [facility] [address] [place], [place] [address] [phone]   [facility] [address], [place] [phone]   [facility]    [phone]   [facility]    [phone]   [facility]  [address], [place] Phone:  [phone], Fax:  [phone] Hours of Operation:  9 am - 6 pm, M-F.  Also accepts Medicaid/Medicare and self-pay.  [facility]  [address], [place] Phone: [phone], Fax: [phone]. Hours of  Operation:  8:30 am - 5:30 pm, M-F.  Also accepts Medicaid and self-pay.  [facility] [place] [address], [place] Phone: [phone]   [facility] [address], [place], [place] [phone], Ext. [phone] Mondays & Thursdays: 7-9 AM.  First 15 patients are seen on a first come, first serve basis.    Medicaid-accepting [facility] [place] Providers:  Organization         Address  Phone   Notes  [facility] [address], [place] [phone] Also accepts self-pay patients.  [facility] [address], [place]  [phone]   [facility] [address], [place] [phone]   [facility] [address] [place] [address], [place] [phone]   [facility] Rakers[facility] Bland [address], [place]   [phone] Only accepts [place] patients after they have their name applied to their card.   Self-Pay (no insurance) in [facility]:  Organization         Address  Phone   Notes  Sickle Cell Patients, [facility] [address], [place] [phone]   [facility] [address], [place] [phone]   [facility] [place]  [address], [place] [phone]   [facility]/Dr. Osei-Bonsu  [address] [place] [address], [place] or [phone], [place] [phone] Phone number for both [place] and [place] locations is the same.  [facility] [address], [place] [phone]   [facility] [address] [place] [address], [place] or [address] [phone]   [facility] [address], [place] [phone], phone; [phone], fax Sees patients 1st and 3rd Saturday of every month.  Must not qualify for  public or private insurance (i.e. Medicaid, Medicare,  [place] Health Choice, Veterans' Benefits)  Household income should be no more than 200% of the poverty level The clinic cannot treat you if you are pregnant or think you are pregnant  Sexually transmitted diseases are not treated at the clinic.

## 2014-09-14 NOTE — ED Provider Notes (Signed)
CSN: [id number]     Arrival date & time 09/14/14  1644 History  This chart was scribed for non-physician practitioner Allen DerryMercedes Camprubi-Soms, PA-C working with Arby BarretteMarcy Pfeiffer, MD by Annye AsaAnna Dorsett, ED Scribe. This patient was seen in room WTR9/WTR9 and the patient's care was started at 6:33 PM.     Chief Complaint  Patient presents with  . Facial Pain  . Flank Pain   Patient is a 53 y.o. male presenting with URI. The history is provided by the patient. No language interpreter was used.  URI Presenting symptoms: congestion, cough (dry, intermittent) and rhinorrhea (clear)   Presenting symptoms: no ear pain, no fever and no sore throat   Severity:  Moderate Onset quality:  Gradual Duration:  2 months Timing:  Constant Progression:  Unchanged Chronicity:  Recurrent Relieved by:  Nothing Exacerbated by: weather changes. Ineffective treatments:  OTC medications Associated symptoms: myalgias (L side pain, above hip), sinus pain and sneezing   Associated symptoms: no arthralgias, no headaches, no neck pain, no swollen glands and no wheezing   Risk factors: no recent travel and no sick contacts     HPI Comments: Virgel PalingMichael D Laningham is a 53 y.o. male with a PMHx of seasonal allergies and recurrent sinusitis with weather changes, who presents to the Emergency Department complaining of 2 months of allergies and sinusitis. He notes that he has these symptoms often, generally associated with the weather change. He complains of generalized myalgia, popping ears, rhinorrhea (clear nasal drainage), sneezing, unproductive intermittent cough, and itchy, watery eyes. He has utilized OTC allergy medications (Equate Severe Allergy and Flonase) with minimal relief. He reports that his symptoms improve with antibiotics (he was prescribed antibiotics at every ED visit over the past few years for the same issue). He denies sore throat or ear drainage.  He denies fever, chills, chest pain, SOB, wheezing, abdominal pain,  n/v/d/c, urinary changes, arthralgias, or weakness/paresthesias.  He is also endorsing some aching on his left side (just above his left hip), states it began gradually this morning, describes it as a throbbing pulling sensation that is aggravated by movement, rated "10/10" but he clarifies that it is not the worst pain of his life, but states it's "severe." Patient explains that he simply woke up with this pain. This pain does not appear at rest, nor with breathing, urination, or anything aside from movement. Denies that the pain is present all the time, just with certain activities. He denies urinary symptoms; denies incontinence, dysuria, hematuria, increased frequency or urges. He denies back pain or numbness in his groin or cauda equina symptoms. He denies recent trauma, injury or fall. He is able to ambulate without pain. He utilized muscle rubs and "green alcohol" with minimal relief. Denies sick contacts.    Past Medical History  Diagnosis Date  . Sinus congestion   . Allergy    History reviewed. No pertinent past surgical history. Family History  Problem Relation Age of Onset  . Hyperlipidemia Father    History  Substance Use Topics  . Smoking status: Never Smoker   . Smokeless tobacco: Not on file  . Alcohol Use: No    Review of Systems  Constitutional: Negative for fever and chills.  HENT: Positive for congestion, postnasal drip, rhinorrhea (clear), sinus pressure and sneezing. Negative for drooling, ear discharge, ear pain, facial swelling, sore throat, tinnitus, trouble swallowing and voice change.   Eyes: Positive for discharge (watery) and itching. Negative for photophobia, redness and visual disturbance.  Respiratory: Positive  for cough (dry, intermittent). Negative for chest tightness, shortness of breath and wheezing.   Cardiovascular: Negative for chest pain.  Gastrointestinal: Negative for nausea, vomiting, abdominal pain, diarrhea and constipation.  Genitourinary:  Negative for dysuria, urgency, frequency, hematuria, flank pain, decreased urine volume, difficulty urinating, penile pain and testicular pain.  Musculoskeletal: Positive for myalgias (L side pain, above hip). Negative for arthralgias, back pain, gait problem and neck pain.  Skin: Negative for rash.  Neurological: Negative for weakness, numbness and headaches.    10 Systems reviewed and all are negative for acute change except as noted in the HPI.    Allergies  Review of patient's allergies indicates no known allergies.  Home Medications   Prior to Admission medications   Medication Sig Start Date End Date Taking? Authorizing Provider  amoxicillin (AMOXIL) 500 MG capsule Take 1 capsule (500 mg total) by mouth 3 (three) times daily. 04/20/14   Robyn M Hess, PA-C  fluticasone (FLONASE) 50 MCG/ACT nasal spray Place 2 sprays into both nostrils daily. 04/20/14   Robyn M Hess, PA-C   BP 129/89  Pulse 89  Temp(Src) 98.3 F (36.8 C) (Oral)  Resp 16  SpO2 100% Physical Exam  Nursing note and vitals reviewed. Constitutional: He is oriented to person, place, and time. Vital signs are normal. He appears well-developed and well-nourished. No distress.  Afebrile, nontoxic, NAD  HENT:  Head: Normocephalic and atraumatic.  Right Ear: Tympanic membrane, external ear and ear canal normal.  Left Ear: Tympanic membrane, external ear and ear canal normal.  Nose: Mucosal edema and rhinorrhea present. Right sinus exhibits no maxillary sinus tenderness and no frontal sinus tenderness. Left sinus exhibits no maxillary sinus tenderness and no frontal sinus tenderness.  Mouth/Throat: Uvula is midline, oropharynx is clear and moist and mucous membranes are normal. No trismus in the jaw. No uvula swelling.  Ears clear bilaterally. Oropharynx clear without tonsillar swelling, erythema, or exudate B/l nasal turbinates edematous and erythematous with mild clear rhinorrhea noted. No maxillary or frontal sinus  tenderness  Eyes: Conjunctivae and EOM are normal. Pupils are equal, round, and reactive to light. Right eye exhibits no discharge. Left eye exhibits no discharge.  Conjunctiva clear and noninjected, no ocular drainage  Neck: Normal range of motion. Neck supple.  Cardiovascular: Normal rate, regular rhythm, normal heart sounds and intact distal pulses.  Exam reveals no gallop and no friction rub.   No murmur heard. Pulmonary/Chest: Effort normal and breath sounds normal. No respiratory distress. He has no decreased breath sounds. He has no wheezes. He has no rhonchi. He has no rales.  CTAB in all lung fields, no w/r/r  Abdominal: Soft. Normal appearance and bowel sounds are normal. He exhibits no distension. There is no tenderness. There is no rigidity, no rebound, no guarding and no CVA tenderness.    Soft, NTND, +BS throughout, no R/G/R, no CVA TTP. Mild muscle tenderness along the lateral L side just above the pelvic crest/ASIS in the midaxillary line, with mild spasm noted. No hernias noted.   Musculoskeletal: Normal range of motion.  MAE x4, strength 5/5 in all extremities, sensation grossly intact in all extremities, ambulatory without assistance. All spinal levels nonTTP without spasms. L hip nonTTP with FROM intact  Neurological: He is alert and oriented to person, place, and time. He has normal strength. No sensory deficit. Gait normal.  Skin: Skin is warm, dry and intact. No rash noted.  Psychiatric: He has a normal mood and affect. His behavior is normal.  ED Course  Procedures   DIAGNOSTIC STUDIES: Oxygen Saturation is 100% on RA, normal by my interpretation.    COORDINATION OF CARE: 6:47 PM Discussed treatment plan with pt at bedside and pt agreed to plan: patient will be prescribed antibiotics and was counseled to continue his current Flonase regimen. He will also be given a referral to an allergist. He will also be given pain medications for his side pain: he was counseled  to continue his current home care.   Labs Review Labs Reviewed - No data to display  Imaging Review No results found.   EKG Interpretation None      MDM   Final diagnoses:  Seasonal allergies  Nasal sinus congestion  Acute recurrent pansinusitis  Muscle strain    53y/o male with sinusitis complaint, recurrent and due to duration of symptoms will give abx as well as flonase and claritin. Also has muscular pain at the ASIS/pelvic crest border, which seems consistent with a strain of abd wall muscles possibly from sneezing. U/A obtained to r/o urinary source which was neg. Doubt any abdominal or hip joint etiology warranting further work up or evaluation. Discussed heat and symptomatic control with NSAIDs and ultram for breakthrough pain. Urged pt to see an allergist for ongoing treatment of his seasonal allergies. Pt seen here multiple times in last 3 yrs for sinusitis that lasts >1 month and always comes with season changes, and he recurrently has needed abx. Discussed importance of seeing PCP as well to have ongoing medical care. Will have him f/up in one week. I explained the diagnosis and have given explicit precautions to return to the ER including for any other new or worsening symptoms. The patient understands and accepts the medical plan as it's been dictated and I have answered their questions. Discharge instructions concerning home care and prescriptions have been given. The patient is STABLE and is discharged to home in good condition.   I personally performed the services described in this documentation, which was scribed in my presence. The recorded information has been reviewed and is accurate.  BP 129/89  Pulse 89  Temp(Src) 98.3 F (36.8 C) (Oral)  Resp 16  SpO2 100%  Meds ordered this encounter  Medications  . amoxicillin-clavulanate (AUGMENTIN) 875-125 MG per tablet    Sig: Take 1 tablet by mouth 2 (two) times daily. One po bid x 7 days    Dispense:  14 tablet     Refill:  0    Order Specific Question:  Supervising Provider    Answer:  Eber Hong D [[id number]]  . fluticasone (FLONASE) 50 MCG/ACT nasal spray    Sig: Place 2 sprays into both nostrils daily.    Dispense:  16 g    Refill:  0    Order Specific Question:  Supervising Provider    Answer:  Eber Hong D [[id number]]  . loratadine (CLARITIN) 10 MG tablet    Sig: Take 1 tablet (10 mg total) by mouth daily.    Dispense:  30 tablet    Refill:  0    Order Specific Question:  Supervising Provider    Answer:  Eber Hong D [[id number]]  . naproxen (NAPROSYN) 500 MG tablet    Sig: Take 1 tablet (500 mg total) by mouth 2 (two) times daily as needed for mild pain, moderate pain or headache (TAKE WITH MEALS.).    Dispense:  20 tablet    Refill:  0    Order Specific Question:  Supervising Provider  Answer:  Eber Hong D [[id number]]  . traMADol (ULTRAM) 50 MG tablet    Sig: Take 1 tablet (50 mg total) by mouth every 6 (six) hours as needed.    Dispense:  15 tablet    Refill:  0    Order Specific Question:  Supervising Provider    Answer:  Vida Roller [address] Camprubi-Soms, PA-C 09/14/14 2145

## 2014-09-14 NOTE — ED Notes (Signed)
Pt states that he has had sinus pressure, ears popping, numbness in his R hand all x 1 month. Also states that he has a pain in his L flank that is exacerbated by moving. Alert and oriented.

## 2014-09-30 NOTE — ED Provider Notes (Signed)
Medical screening examination/treatment/procedure(s) were performed by non-physician practitioner and as supervising physician I was immediately available for consultation/collaboration.   EKG Interpretation None       Arby BarretteMarcy Clayborn Milnes, MD 09/30/14 [phone]

## 2015-05-24 ENCOUNTER — Encounter (HOSPITAL_COMMUNITY): Payer: Self-pay | Admitting: Emergency Medicine

## 2015-05-24 ENCOUNTER — Emergency Department (HOSPITAL_COMMUNITY)
Admission: EM | Admit: 2015-05-24 | Discharge: 2015-05-24 | Disposition: A | Discharge status: Home / Self Care | Payer: Self-pay | Attending: Emergency Medicine | Admitting: Emergency Medicine

## 2015-05-24 DIAGNOSIS — Z79899 Other long term (current) drug therapy: Secondary | ICD-10-CM | POA: Insufficient documentation

## 2015-05-24 DIAGNOSIS — Z7951 Long term (current) use of inhaled steroids: Secondary | ICD-10-CM | POA: Insufficient documentation

## 2015-05-24 DIAGNOSIS — J329 Chronic sinusitis, unspecified: Secondary | ICD-10-CM | POA: Insufficient documentation

## 2015-05-24 DIAGNOSIS — Z791 Long term (current) use of non-steroidal anti-inflammatories (NSAID): Secondary | ICD-10-CM | POA: Insufficient documentation

## 2015-05-24 DIAGNOSIS — J45909 Unspecified asthma, uncomplicated: Secondary | ICD-10-CM | POA: Insufficient documentation

## 2015-05-24 DIAGNOSIS — Z792 Long term (current) use of antibiotics: Secondary | ICD-10-CM | POA: Insufficient documentation

## 2015-05-24 MED ORDER — FLUTICASONE PROPIONATE 50 MCG/ACT NA SUSP: 2.0000 | Freq: Every day | NASAL | Status: DC

## 2015-05-24 MED ORDER — PSEUDOEPHEDRINE HCL 30 MG PO TABS: 30.0000 mg | ORAL_TABLET | ORAL | Status: DC | PRN

## 2015-05-24 NOTE — ED Provider Notes (Signed)
CSN: [id number]     Arrival date & time 05/24/15  1847 History  This chart was scribed for non-physician practitioner, Jaynie Crumble, PA-C working with Richardean Canal, MD by Placido Sou, ED scribe. This patient was seen in room WTR5/WTR5 and the patient's care was started at 7:28 PM.  Chief Complaint  Patient presents with  . Nasal Congestion   The history is provided by the patient. No language interpreter was used.   HPI Comments: Ricardo [name] is a 54 y.o. male, with a history of sinus congestion and asthma, who presents to the Emergency Department complaining of generalized symptoms resulting from allergies with onset 1 months ago. He notes coughing, sneezing, and rhinorrhea as associated symptoms. He notes flushing his sinuses, drinking herbal teas, using "body cleansers", OTC Equate, and Nasonex with no relief of symptoms. He denies a fever or any other associated symptoms.  PCP: None  Past Medical History  Diagnosis Date  . Sinus congestion   . Allergy    History reviewed. No pertinent past surgical history. Family History  Problem Relation Age of Onset  . Hyperlipidemia Father    History  Substance Use Topics  . Smoking status: Never Smoker   . Smokeless tobacco: Not on file  . Alcohol Use: No    Review of Systems  Constitutional: Negative for fever and chills.  HENT: Positive for congestion and rhinorrhea.   Respiratory: Positive for cough.       Allergies  Review of patient's allergies indicates no known allergies.  Home Medications   Prior to Admission medications   Medication Sig Start Date End Date Taking? Authorizing Provider  amoxicillin (AMOXIL) 500 MG capsule Take 1 capsule (500 mg total) by mouth 3 (three) times daily. 04/20/14   Robyn M Hess, PA-C  amoxicillin-clavulanate (AUGMENTIN) 875-125 MG per tablet Take 1 tablet by mouth 2 (two) times daily. One po bid x 7 days 09/14/14   Mercedes Camprubi-Soms, PA-C  fluticasone (FLONASE) 50 MCG/ACT  nasal spray Place 2 sprays into both nostrils daily. 04/20/14   Robyn M Hess, PA-C  fluticasone (FLONASE) 50 MCG/ACT nasal spray Place 2 sprays into both nostrils daily. 09/14/14   Mercedes Camprubi-Soms, PA-C  loratadine (CLARITIN) 10 MG tablet Take 1 tablet (10 mg total) by mouth daily. 09/14/14   Mercedes Camprubi-Soms, PA-C  naproxen (NAPROSYN) 500 MG tablet Take 1 tablet (500 mg total) by mouth 2 (two) times daily as needed for mild pain, moderate pain or headache (TAKE WITH MEALS.). 09/14/14   Mercedes Camprubi-Soms, PA-C  traMADol (ULTRAM) 50 MG tablet Take 1 tablet (50 mg total) by mouth every 6 (six) hours as needed. 09/14/14   Mercedes Camprubi-Soms, PA-C   BP 142/91 mmHg  Pulse 81  Temp(Src) 98.2 F (36.8 C) (Oral)  Resp 16  SpO2 99% Physical Exam  Constitutional: He is oriented to person, place, and time. He appears well-developed and well-nourished. No distress.  HENT:  Head: Normocephalic and atraumatic.  Right Ear: Tympanic membrane, external ear and ear canal normal.  Left Ear: Tympanic membrane, external ear and ear canal normal.  Nose: Mucosal edema and rhinorrhea present. Right sinus exhibits no maxillary sinus tenderness and no frontal sinus tenderness. Left sinus exhibits no maxillary sinus tenderness and no frontal sinus tenderness.  Mouth/Throat: Uvula is midline and oropharynx is clear and moist.  Eyes: Conjunctivae and EOM are normal. Pupils are equal, round, and reactive to light.  Neck: Normal range of motion. Neck supple. No tracheal deviation present.  Cardiovascular:  Normal rate, regular rhythm and normal heart sounds.   Pulmonary/Chest: Breath sounds normal. No respiratory distress. He has no wheezes. He has no rales.  Neurological: He is alert and oriented to person, place, and time.  Skin: Skin is warm and dry.  Psychiatric: He has a normal mood and affect. His behavior is normal.  Nursing note and vitals reviewed.   ED Course  Procedures  DIAGNOSTIC  STUDIES: Oxygen Saturation is 99% on RA, normal by my interpretation.    COORDINATION OF CARE: 7:37 PM Discussed treatment plan with pt at bedside and pt agreed to plan.  Labs Review Labs Reviewed - No data to display  Imaging Review No results found.   EKG Interpretation None      MDM   Final diagnoses:  Chronic sinusitis, unspecified location    Patient with chronic rhinorrhea, sinus congestion. States ran out of [employer]. Currently taking allergy medication once a day, does saline rinses with no improvement. Patient is afebrile. No sinus tenderness. I do not think he has acute bacterial sinusitis infection. Most likely chronic sinusitis versus allergic rhinitis. Continue allergy medication, add steroidal nasal spray, add Sudafed. Follow-up with primary care doctor.  Filed Vitals:   05/24/15 1854  BP: 142/91  Pulse: 81  Temp: 98.2 F (36.8 C)  TempSrc: Oral  Resp: 16  SpO2: 99%   I personally performed the services described in this documentation, which was scribed in my presence. The recorded information has been reviewed and is accurate.     Jaynie Crumble, PA-C 05/24/15 1943  Richardean Canal, MD 05/24/15 2214

## 2015-05-24 NOTE — ED Notes (Signed)
Pt states that he has had allergy like symptoms x 2 months and now feels like he may have a sinus infection. Alert and oriented.

## 2015-05-24 NOTE — Discharge Instructions (Signed)
Continue allergy medication daily. Continue saline flushes. Take sudafed up to  every 4 hrs (see how much sudafed is in your allergy medication). flonase daily. Follow up with primary care doctor.    Sinusitis Sinusitis is redness, soreness, and inflammation of the paranasal sinuses. Paranasal sinuses are air pockets within the bones of your face (beneath the eyes, the middle of the forehead, or above the eyes). In healthy paranasal sinuses, mucus is able to drain out, and air is able to circulate through them by way of your nose. However, when your paranasal sinuses are inflamed, mucus and air can become trapped. This can allow bacteria and other germs to grow and cause infection. Sinusitis can develop quickly and last only a short time (acute) or continue over a long period (chronic). Sinusitis that lasts for more than 12 weeks is considered chronic.  CAUSES  Causes of sinusitis include:  Allergies.  Structural abnormalities, such as displacement of the cartilage that separates your nostrils (deviated septum), which can decrease the air flow through your nose and sinuses and affect sinus drainage.  Functional abnormalities, such as when the small hairs (cilia) that line your sinuses and help remove mucus do not work properly or are not present. SIGNS AND SYMPTOMS  Symptoms of acute and chronic sinusitis are the same. The primary symptoms are pain and pressure around the affected sinuses. Other symptoms include:  Upper toothache.  Earache.  Headache.  Bad breath.  Decreased sense of smell and taste.  A cough, which worsens when you are lying flat.  Fatigue.  Fever.  Thick drainage from your nose, which often is green and may contain pus (purulent).  Swelling and warmth over the affected sinuses. DIAGNOSIS  Your health care provider will perform a physical exam. During the exam, your health care provider may:  Look in your nose for signs of abnormal growths in your nostrils  (nasal polyps).  Tap over the affected sinus to check for signs of infection.  View the inside of your sinuses (endoscopy) using an imaging device that has a light attached (endoscope). If your health care provider suspects that you have chronic sinusitis, one or more of the following tests may be recommended:  Allergy tests.  Nasal culture. A sample of mucus is taken from your nose, sent to a lab, and screened for bacteria.  Nasal cytology. A sample of mucus is taken from your nose and examined by your health care provider to determine if your sinusitis is related to an allergy. TREATMENT  Most cases of acute sinusitis are related to a viral infection and will resolve on their own within 10 days. Sometimes medicines are prescribed to help relieve symptoms (pain medicine, decongestants, nasal steroid sprays, or saline sprays).  However, for sinusitis related to a bacterial infection, your health care provider will prescribe antibiotic medicines. These are medicines that will help kill the bacteria causing the infection.  Rarely, sinusitis is caused by a fungal infection. In theses cases, your health care provider will prescribe antifungal medicine. For some cases of chronic sinusitis, surgery is needed. Generally, these are cases in which sinusitis recurs more than 3 times per year, despite other treatments. HOME CARE INSTRUCTIONS   Drink plenty of water. Water helps thin the mucus so your sinuses can drain more easily.  Use a humidifier.  Inhale steam 3 to 4 times a day (for example, sit in the bathroom with the shower running).  Apply a warm, moist washcloth to your face 3 to 4  times a day, or as directed by your health care provider.  Use saline nasal sprays to help moisten and clean your sinuses.  Take medicines only as directed by your health care provider.  If you were prescribed either an antibiotic or antifungal medicine, finish it all even if you start to feel better. SEEK  IMMEDIATE MEDICAL CARE IF:  You have increasing pain or severe headaches.  You have nausea, vomiting, or drowsiness.  You have swelling around your face.  You have vision problems.  You have a stiff neck.  You have difficulty breathing. MAKE SURE YOU:   Understand these instructions.  Will watch your condition.  Will get help right away if you are not doing well or get worse. Document Released: 11/19/2005 Document Revised: 04/05/2014 Document Reviewed: 12/04/2011 [facility] Patient Information 2015 [place], [place]. This information is not intended to replace advice given to you by your health care provider. Make sure you discuss any questions you have with your health care provider.

## 2015-11-14 ENCOUNTER — Emergency Department (HOSPITAL_COMMUNITY)
Admission: EM | Admit: 2015-11-14 | Discharge: 2015-11-14 | Disposition: A | Discharge status: Home / Self Care | Payer: Self-pay | Attending: Emergency Medicine | Admitting: Emergency Medicine

## 2015-11-14 ENCOUNTER — Encounter (HOSPITAL_COMMUNITY): Payer: Self-pay | Admitting: Emergency Medicine

## 2015-11-14 DIAGNOSIS — Z7951 Long term (current) use of inhaled steroids: Secondary | ICD-10-CM | POA: Insufficient documentation

## 2015-11-14 DIAGNOSIS — J329 Chronic sinusitis, unspecified: Secondary | ICD-10-CM

## 2015-11-14 DIAGNOSIS — Z792 Long term (current) use of antibiotics: Secondary | ICD-10-CM | POA: Insufficient documentation

## 2015-11-14 DIAGNOSIS — J320 Chronic maxillary sinusitis: Secondary | ICD-10-CM | POA: Insufficient documentation

## 2015-11-14 DIAGNOSIS — H9209 Otalgia, unspecified ear: Secondary | ICD-10-CM | POA: Insufficient documentation

## 2015-11-14 MED ORDER — PSEUDOEPHEDRINE HCL 30 MG PO TABS: 30.0000 mg | ORAL_TABLET | ORAL | Status: DC | PRN

## 2015-11-14 MED ORDER — NAPROXEN 500 MG PO TABS: 500.0000 mg | ORAL_TABLET | Freq: Two times a day (BID) | ORAL | Status: DC | PRN

## 2015-11-14 MED ORDER — FLUTICASONE PROPIONATE 50 MCG/ACT NA SUSP: 1.0000 | Freq: Every day | NASAL | Status: DC

## 2015-11-14 NOTE — ED Provider Notes (Signed)
CSN: [id number]     Arrival date & time 11/14/15  1858 History  By signing my name below, I, Budd PalmerVanessa Prueter, attest that this documentation has been prepared under the direction and in the presence of AvayaSamantha Dowless, PA-C. Electronically Signed: Budd PalmerVanessa Prueter, ED Scribe. 11/14/2015. 7:50 PM.    Chief Complaint  Patient presents with  . sinus complaint   . Cough   The history is provided by the patient. No language interpreter was used.   HPI Comments: Ricardo [name] is a 54 y.o. male with a PMHx of sinus congestion and allergies who presents to the Emergency Department complaining of sinus pressure and a productive cough onset 2 month ago. He reports associated HA, possible subjective fever, generalized myalgias, rhinorrhea, post-nasal drip, and ear pain. He notes he has tried Equate Severe Allergy with mild relief. He also reports he recently ran out of Flonase and has no money to buy more. He also notes he has high blood pressure only when his sinus congestion flares up. He notes he is currently unemployed and does not have insurance. He denies having received a flu shot.   Past Medical History  Diagnosis Date  . Sinus congestion   . Allergy    History reviewed. No pertinent past surgical history. Family History  Problem Relation Age of Onset  . Hyperlipidemia Father    Social History  Substance Use Topics  . Smoking status: Never Smoker   . Smokeless tobacco: None  . Alcohol Use: No    Review of Systems  Constitutional: Positive for fever (subjective).  HENT: Positive for ear pain, postnasal drip, rhinorrhea and sinus pressure.   Respiratory: Positive for cough.   Musculoskeletal: Positive for myalgias.  Neurological: Positive for headaches.  All other systems reviewed and are negative.   Allergies  Review of patient's allergies indicates no known allergies.  Home Medications   Prior to Admission medications   Medication Sig Start Date End Date Taking?  Authorizing Provider  amoxicillin (AMOXIL) 500 MG capsule Take 1 capsule (500 mg total) by mouth 3 (three) times daily. 04/20/14   Robyn M Hess, PA-C  amoxicillin-clavulanate (AUGMENTIN) 875-125 MG per tablet Take 1 tablet by mouth 2 (two) times daily. One po bid x 7 days 09/14/14   Mercedes Camprubi-Soms, PA-C  fluticasone (FLONASE) 50 MCG/ACT nasal spray Place 2 sprays into both nostrils daily. 05/24/15   Tatyana Kirichenko, PA-C  loratadine (CLARITIN) 10 MG tablet Take 1 tablet (10 mg total) by mouth daily. 09/14/14   Mercedes Camprubi-Soms, PA-C  naproxen (NAPROSYN) 500 MG tablet Take 1 tablet (500 mg total) by mouth 2 (two) times daily as needed for mild pain, moderate pain or headache (TAKE WITH MEALS.). 09/14/14   Mercedes Camprubi-Soms, PA-C  pseudoephedrine (SUDAFED) 30 MG tablet Take 1 tablet (30 mg total) by mouth every 4 (four) hours as needed for congestion. 05/24/15   Tatyana Kirichenko, PA-C  traMADol (ULTRAM) 50 MG tablet Take 1 tablet (50 mg total) by mouth every 6 (six) hours as needed. 09/14/14   Mercedes Camprubi-Soms, PA-C   BP 149/96 mmHg  Pulse 96  Temp(Src) 98.3 F (36.8 C) (Oral)  Resp 17  SpO2 100% Physical Exam  Constitutional: He is oriented to person, place, and time. He appears well-developed and well-nourished. No distress.  HENT:  Head: Normocephalic and atraumatic.  Right Ear: Tympanic membrane and external ear normal. No drainage.  Left Ear: Tympanic membrane and external ear normal. No drainage.  Nose: Right sinus exhibits maxillary sinus  tenderness. Right sinus exhibits no frontal sinus tenderness. Left sinus exhibits maxillary sinus tenderness. Left sinus exhibits no frontal sinus tenderness.  Mouth/Throat: Uvula is midline and mucous membranes are normal. He does not have dentures. No oral lesions. No trismus in the jaw. Normal dentition. No dental abscesses, uvula swelling, lacerations or dental caries. No oropharyngeal exudate, posterior oropharyngeal edema,  posterior oropharyngeal erythema or tonsillar abscesses.  TM's clear bilaterally, mucosal edema in the nose  Eyes: Conjunctivae are normal. Right eye exhibits no discharge. Left eye exhibits no discharge. No scleral icterus.  Neck: Neck supple.  Cardiovascular: Normal rate, normal heart sounds and intact distal pulses.  Exam reveals no gallop and no friction rub.   No murmur heard. Pulmonary/Chest: Effort normal and breath sounds normal. No respiratory distress. He has no wheezes. He has no rales. He exhibits no tenderness.  Lungs are clear bilaterally   Abdominal: Soft.  Lymphadenopathy:    He has no cervical adenopathy.  Neurological: He is alert and oriented to person, place, and time. Coordination normal.  Skin: Skin is warm and dry. No rash noted. He is not diaphoretic. No erythema. No pallor.  Psychiatric: He has a normal mood and affect. His behavior is normal.  Nursing note and vitals reviewed.   ED Course  Procedures  DIAGNOSTIC STUDIES: Oxygen Saturation is 100% on RA, normal by my interpretation.    COORDINATION OF CARE: 7:47 PM - Discussed normal breath sounds and probable viral infection, as well as probable chronic sinus congestion. Discussed plans to review pt's chart to see which medications have worked for his sinuses in the past. Avdised pt to take ibuprofen for myalgias. Pt advised of plan for treatment and pt agrees.  Labs Review Labs Reviewed - No data to display  Imaging Review No results found. I have personally reviewed and evaluated these images and lab results as part of my medical decision-making.   EKG Interpretation None      MDM   Final diagnoses:  Chronic sinusitis, unspecified location    Patient complaining of symptoms of sinusitis. This is chronic for the pt. He has been seen and evaluated for this in ED many times for same. Do not suspect bacterial source. Pt states that he is out of his sinus medication. We will d/c home with flonase,  sudafed, and naprosyn. No cough. Lungs CTAB. No SOB. Throat non-erythematous. Patient instructions given for warm saline nasal washes.  Recommendations for follow-up with primary care physician. Return precautions outlined in patient discharge instructions.     I personally performed the services described in this documentation, which was scribed in my presence. The recorded information has been reviewed and is accurate.     Lester Kinsman [place], PA-C 11/15/15 2315  Lorre Nick, MD 11/17/15 [phone]

## 2015-11-14 NOTE — Discharge Instructions (Signed)
Sinusitis, Adult °Sinusitis is redness, soreness, and inflammation of the paranasal sinuses. Paranasal sinuses are air pockets within the bones of your face. They are located beneath your eyes, in the middle of your forehead, and above your eyes. In healthy paranasal sinuses, mucus is able to drain out, and air is able to circulate through them by way of your nose. However, when your paranasal sinuses are inflamed, mucus and air can become trapped. This can allow bacteria and other germs to grow and cause infection. °Sinusitis can develop quickly and last only a short time (acute) or continue over a long period (chronic). Sinusitis that lasts for more than 12 weeks is considered chronic. °CAUSES °Causes of sinusitis include: °· Allergies. °· Structural abnormalities, such as displacement of the cartilage that separates your nostrils (deviated septum), which can decrease the air flow through your nose and sinuses and affect sinus drainage. °· Functional abnormalities, such as when the small hairs (cilia) that line your sinuses and help remove mucus do not work properly or are not present. °SIGNS AND SYMPTOMS °Symptoms of acute and chronic sinusitis are the same. The primary symptoms are pain and pressure around the affected sinuses. Other symptoms include: °· Upper toothache. °· Earache. °· Headache. °· Bad breath. °· Decreased sense of smell and taste. °· A cough, which worsens when you are lying flat. °· Fatigue. °· Fever. °· Thick drainage from your nose, which often is green and may contain pus (purulent). °· Swelling and warmth over the affected sinuses. °DIAGNOSIS °Your health care provider will perform a physical exam. During your exam, your health care provider may perform any of the following to help determine if you have acute sinusitis or chronic sinusitis: °· Look in your nose for signs of abnormal growths in your nostrils (nasal polyps). °· Tap over the affected sinus to check for signs of  infection. °· View the inside of your sinuses using an imaging device that has a light attached (endoscope). °If your health care provider suspects that you have chronic sinusitis, one or more of the following tests may be recommended: °· Allergy tests. °· Nasal culture. A sample of mucus is taken from your nose, sent to a lab, and screened for bacteria. °· Nasal cytology. A sample of mucus is taken from your nose and examined by your health care provider to determine if your sinusitis is related to an allergy. °TREATMENT °Most cases of acute sinusitis are related to a viral infection and will resolve on their own within 10 days. Sometimes, medicines are prescribed to help relieve symptoms of both acute and chronic sinusitis. These may include pain medicines, decongestants, nasal steroid sprays, or saline sprays. °However, for sinusitis related to a bacterial infection, your health care provider will prescribe antibiotic medicines. These are medicines that will help kill the bacteria causing the infection. °Rarely, sinusitis is caused by a fungal infection. In these cases, your health care provider will prescribe antifungal medicine. °For some cases of chronic sinusitis, surgery is needed. Generally, these are cases in which sinusitis recurs more than 3 times per year, despite other treatments. °HOME CARE INSTRUCTIONS °· Drink plenty of water. Water helps thin the mucus so your sinuses can drain more easily. °· Use a humidifier. °· Inhale steam 3-4 times a day (for example, sit in the bathroom with the shower running). °· Apply a warm, moist washcloth to your face 3-4 times a day, or as directed by your health care provider. °· Use saline nasal sprays to help   moisten and clean your sinuses.  Take medicines only as directed by your health care provider.  If you were prescribed either an antibiotic or antifungal medicine, finish it all even if you start to feel better. SEEK IMMEDIATE MEDICAL CARE IF:  You have  increasing pain or severe headaches.  You have nausea, vomiting, or drowsiness.  You have swelling around your face.  You have vision problems.  You have a stiff neck.  You have difficulty breathing.   This information is not intended to replace advice given to you by your health care provider. Make sure you discuss any questions you have with your health care provider.   Document Released: 11/19/2005 Document Revised: 12/10/2014 Document Reviewed: 12/04/2011 Elsevier Interactive Patient Education 2016 [employer]  Upper Respiratory Infection, Adult Most upper respiratory infections (URIs) are a viral infection of the air passages leading to the lungs. A URI affects the nose, throat, and upper air passages. The most common type of URI is nasopharyngitis and is typically referred to as "the common cold." URIs run their course and usually go away on their own. Most of the time, a URI does not require medical attention, but sometimes a bacterial infection in the upper airways can follow a viral infection. This is called a secondary infection. Sinus and middle ear infections are common types of secondary upper respiratory infections. Bacterial pneumonia can also complicate a URI. A URI can worsen asthma and chronic obstructive pulmonary disease (COPD). Sometimes, these complications can require emergency medical care and may be life threatening.  CAUSES Almost all URIs are caused by viruses. A virus is a type of germ and can spread from one person to another.  RISKS FACTORS You may be at risk for a URI if:   You smoke.   You have chronic heart or lung disease.  You have a weakened defense (immune) system.   You are very young or very old.   You have nasal allergies or asthma.  You work in crowded or poorly ventilated areas.  You work in health care facilities or schools. SIGNS AND SYMPTOMS  Symptoms typically develop 2-3 days after you come in contact with a cold virus. Most  viral URIs last 7-10 days. However, viral URIs from the influenza virus (flu virus) can last 14-18 days and are typically more severe. Symptoms may include:   Runny or stuffy (congested) nose.   Sneezing.   Cough.   Sore throat.   Headache.   Fatigue.   Fever.   Loss of appetite.   Pain in your forehead, behind your eyes, and over your cheekbones (sinus pain).  Muscle aches.  DIAGNOSIS  Your health care provider may diagnose a URI by:  Physical exam.  Tests to check that your symptoms are not due to another condition such as:  Strep throat.  Sinusitis.  Pneumonia.  Asthma. TREATMENT  A URI goes away on its own with time. It cannot be cured with medicines, but medicines may be prescribed or recommended to relieve symptoms. Medicines may help:  Reduce your fever.  Reduce your cough.  Relieve nasal congestion. HOME CARE INSTRUCTIONS   Take medicines only as directed by your health care provider.   Gargle warm saltwater or take cough drops to comfort your throat as directed by your health care provider.  Use a warm mist humidifier or inhale steam from a shower to increase air moisture. This may make it easier to breathe.  Drink enough fluid to keep your urine clear  or pale yellow.   Eat soups and other clear broths and maintain good nutrition.   Rest as needed.   Return to work when your temperature has returned to normal or as your health care provider advises. You may need to stay home longer to avoid infecting others. You can also use a face mask and careful hand washing to prevent spread of the virus.  Increase the usage of your inhaler if you have asthma.   Do not use any tobacco products, including cigarettes, chewing tobacco, or electronic cigarettes. If you need help quitting, ask your health care provider. PREVENTION  The best way to protect yourself from getting a cold is to practice good hygiene.   Avoid oral or hand contact with  people with cold symptoms.   Wash your hands often if contact occurs.  There is no clear evidence that vitamin C, vitamin E, echinacea, or exercise reduces the chance of developing a cold. However, it is always recommended to get plenty of rest, exercise, and practice good nutrition.  SEEK MEDICAL CARE IF:   You are getting worse rather than better.   Your symptoms are not controlled by medicine.   You have chills.  You have worsening shortness of breath.  You have brown or red mucus.  You have yellow or brown nasal discharge.  You have pain in your face, especially when you bend forward.  You have a fever.  You have swollen neck glands.  You have pain while swallowing.  You have white areas in the back of your throat. SEEK IMMEDIATE MEDICAL CARE IF:   You have severe or persistent:  Headache.  Ear pain.  Sinus pain.  Chest pain.  You have chronic lung disease and any of the following:  Wheezing.  Prolonged cough.  Coughing up blood.  A change in your usual mucus.  You have a stiff neck.  You have changes in your:  Vision.  Hearing.  Thinking.  Mood. MAKE SURE YOU:   Understand these instructions.  Will watch your condition.  Will get help right away if you are not doing well or get worse.   This information is not intended to replace advice given to you by your health care provider. Make sure you discuss any questions you have with your health care provider.   Follow up with your primary care provider if symptoms do not improve. Return to the ED if you experience fever, difficulty breathing or swallowing.

## 2015-11-14 NOTE — ED Notes (Signed)
Pt c/o sinus problems with productive cough x 2 months.

## 2016-04-13 ENCOUNTER — Emergency Department (HOSPITAL_COMMUNITY): Payer: Self-pay

## 2016-04-13 ENCOUNTER — Encounter (HOSPITAL_COMMUNITY): Payer: Self-pay

## 2016-04-13 ENCOUNTER — Emergency Department (HOSPITAL_COMMUNITY)
Admission: EM | Admit: 2016-04-13 | Discharge: 2016-04-14 | Disposition: A | Discharge status: Home / Self Care | Payer: Self-pay | Attending: Emergency Medicine | Admitting: Emergency Medicine

## 2016-04-13 DIAGNOSIS — R05 Cough: Secondary | ICD-10-CM

## 2016-04-13 DIAGNOSIS — R300 Dysuria: Secondary | ICD-10-CM | POA: Insufficient documentation

## 2016-04-13 DIAGNOSIS — E785 Hyperlipidemia, unspecified: Secondary | ICD-10-CM | POA: Insufficient documentation

## 2016-04-13 DIAGNOSIS — Z79899 Other long term (current) drug therapy: Secondary | ICD-10-CM | POA: Insufficient documentation

## 2016-04-13 DIAGNOSIS — R059 Cough, unspecified: Secondary | ICD-10-CM

## 2016-04-13 DIAGNOSIS — J320 Chronic maxillary sinusitis: Secondary | ICD-10-CM | POA: Insufficient documentation

## 2016-04-13 LAB — URINE MICROSCOPIC-ADD ON
BACTERIA UA: NONE SEEN
Squamous Epithelial / LPF: NONE SEEN
WBC UA: NONE SEEN WBC/hpf (ref 0–5)

## 2016-04-13 LAB — URINALYSIS, ROUTINE W REFLEX MICROSCOPIC
Bilirubin Urine: NEGATIVE
Glucose, UA: NEGATIVE mg/dL
Ketones, ur: NEGATIVE mg/dL
LEUKOCYTES UA: NEGATIVE
NITRITE: NEGATIVE
PH: 7.5 (ref 5.0–8.0)
Protein, ur: NEGATIVE mg/dL
SPECIFIC GRAVITY, URINE: 1.011 (ref 1.005–1.030)

## 2016-04-13 MED ORDER — PREDNISONE 20 MG PO TABS: 40.0000 mg | ORAL_TABLET | Freq: Every day | ORAL | Status: DC

## 2016-04-13 MED ORDER — BENZONATATE 100 MG PO CAPS
200.0000 mg | ORAL_CAPSULE | Freq: Once | ORAL | Status: AC
  Administered 2016-04-14: 200 mg via ORAL
  Filled 2016-04-13: qty 2

## 2016-04-13 MED ORDER — PREDNISONE 20 MG PO TABS
60.0000 mg | ORAL_TABLET | Freq: Once | ORAL | Status: AC
  Administered 2016-04-14: 60 mg via ORAL
  Filled 2016-04-13: qty 3

## 2016-04-13 MED ORDER — BENZONATATE 100 MG PO CAPS: 200.0000 mg | ORAL_CAPSULE | Freq: Two times a day (BID) | ORAL | Status: DC | PRN

## 2016-04-13 MED ORDER — CETIRIZINE HCL 10 MG PO TABS: 10.0000 mg | ORAL_TABLET | Freq: Every day | ORAL | Status: DC

## 2016-04-13 MED ORDER — FLUTICASONE PROPIONATE 50 MCG/ACT NA SUSP: 2.0000 | Freq: Every day | NASAL | Status: DC

## 2016-04-13 MED ORDER — DOXYCYCLINE HYCLATE 100 MG PO CAPS: 100.0000 mg | ORAL_CAPSULE | Freq: Two times a day (BID) | ORAL | Status: DC

## 2016-04-13 MED ORDER — MONTELUKAST SODIUM 10 MG PO TABS: 10.0000 mg | ORAL_TABLET | Freq: Every day | ORAL | Status: DC

## 2016-04-13 MED ORDER — ALBUTEROL SULFATE HFA 108 (90 BASE) MCG/ACT IN AERS
2.0000 | INHALATION_SPRAY | Freq: Once | RESPIRATORY_TRACT | Status: AC
  Administered 2016-04-14: 2 via RESPIRATORY_TRACT
  Filled 2016-04-13: qty 6.7

## 2016-04-13 NOTE — ED Notes (Signed)
Patient ambulatory to restroom  ?

## 2016-04-13 NOTE — Discharge Instructions (Signed)
Allergies An allergy is an abnormal reaction to a substance by the body's defense system (immune system). Allergies can develop at any age. WHAT CAUSES ALLERGIES? An allergic reaction happens when the immune system mistakenly reacts to a normally harmless substance, called an allergen, as if it were harmful. The immune system releases antibodies to fight the substance. Antibodies eventually release a chemical called histamine into the bloodstream. The release of histamine is meant to protect the body from infection, but it also causes discomfort. An allergic reaction can be triggered by:  Eating an allergen.  Inhaling an allergen.  Touching an allergen. WHAT TYPES OF ALLERGIES ARE THERE? There are many types of allergies. Common types include:  Seasonal allergies. People with this type of allergy are usually allergic to substances that are only present during certain seasons, such as molds and pollens.  Food allergies.  Drug allergies.  Insect allergies.  Animal dander allergies. WHAT ARE SYMPTOMS OF ALLERGIES? Possible allergy symptoms include:  Swelling of the lips, face, tongue, mouth, or throat.  Sneezing, coughing, or wheezing.  Nasal congestion.  Tingling in the mouth.  Rash.  Itching.  Itchy, red, swollen areas of skin (hives).  Watery eyes.  Vomiting.  Diarrhea.  Dizziness.  Lightheadedness.  Fainting.  Trouble breathing or swallowing.  Chest tightness.  Rapid heartbeat. HOW ARE ALLERGIES DIAGNOSED? Allergies are diagnosed with a medical and family history and one or more of the following:  Skin tests.  Blood tests.  A food diary. A food diary is a record of all the foods and drinks you have in a day and of all the symptoms you experience.  The results of an elimination diet. An elimination diet involves eliminating foods from your diet and then adding them back in one by one to find out if a certain food causes an allergic reaction. HOW ARE  ALLERGIES TREATED? There is no cure for allergies, but allergic reactions can be treated with medicine. Severe reactions usually need to be treated at a hospital. HOW CAN REACTIONS BE PREVENTED? The best way to prevent an allergic reaction is by avoiding the substance you are allergic to. Allergy shots and medicines can also help prevent reactions in some cases. People with severe allergic reactions may be able to prevent a life-threatening reaction called anaphylaxis with a medicine given right after exposure to the allergen.   This information is not intended to replace advice given to you by your health care provider. Make sure you discuss any questions you have with your health care provider.   Document Released: 02/12/2003 Document Revised: 12/10/2014 Document Reviewed: 08/31/2014 Elsevier Interactive Patient Education 2016 [employer]  Cough, Adult Coughing is a reflex that clears your throat and your airways. Coughing helps to heal and protect your lungs. It is normal to cough occasionally, but a cough that happens with other symptoms or lasts a long time may be a sign of a condition that needs treatment. A cough may last only 2-3 weeks (acute), or it may last longer than 8 weeks (chronic). CAUSES Coughing is commonly caused by:  Breathing in substances that irritate your lungs.  A viral or bacterial respiratory infection.  Allergies.  Asthma.  Postnasal drip.  Smoking.  Acid backing up from the stomach into the esophagus (gastroesophageal reflux).  Certain medicines.  Chronic lung problems, including COPD (or rarely, lung cancer).  Other medical conditions such as heart failure. HOME CARE INSTRUCTIONS  Pay attention to any changes in your symptoms. Take these actions to  help with your discomfort:  Take medicines only as told by your health care provider.  If you were prescribed an antibiotic medicine, take it as told by your health care provider. Do not stop taking  the antibiotic even if you start to feel better.  Talk with your health care provider before you take a cough suppressant medicine.  Drink enough fluid to keep your urine clear or pale yellow.  If the air is dry, use a cold steam vaporizer or humidifier in your bedroom or your home to help loosen secretions.  Avoid anything that causes you to cough at work or at home.  If your cough is worse at night, try sleeping in a semi-upright position.  Avoid cigarette smoke. If you smoke, quit smoking. If you need help quitting, ask your health care provider.  Avoid caffeine.  Avoid alcohol.  Rest as needed. SEEK MEDICAL CARE IF:   You have new symptoms.  You cough up pus.  Your cough does not get better after 2-3 weeks, or your cough gets worse.  You cannot control your cough with suppressant medicines and you are losing sleep.  You develop pain that is getting worse or pain that is not controlled with pain medicines.  You have a fever.  You have unexplained weight loss.  You have night sweats. SEEK IMMEDIATE MEDICAL CARE IF:  You cough up blood.  You have difficulty breathing.  Your heartbeat is very fast.   This information is not intended to replace advice given to you by your health care provider. Make sure you discuss any questions you have with your health care provider.   Document Released: 05/18/2011 Document Revised: 08/10/2015 Document Reviewed: 01/26/2015 Elsevier Interactive Patient Education 2016 [employer]  Sinusitis, Adult Sinusitis is redness, soreness, and inflammation of the paranasal sinuses. Paranasal sinuses are air pockets within the bones of your face. They are located beneath your eyes, in the middle of your forehead, and above your eyes. In healthy paranasal sinuses, mucus is able to drain out, and air is able to circulate through them by way of your nose. However, when your paranasal sinuses are inflamed, mucus and air can become trapped. This  can allow bacteria and other germs to grow and cause infection. Sinusitis can develop quickly and last only a short time (acute) or continue over a long period (chronic). Sinusitis that lasts for more than 12 weeks is considered chronic. CAUSES Causes of sinusitis include:  Allergies.  Structural abnormalities, such as displacement of the cartilage that separates your nostrils (deviated septum), which can decrease the air flow through your nose and sinuses and affect sinus drainage.  Functional abnormalities, such as when the small hairs (cilia) that line your sinuses and help remove mucus do not work properly or are not present. SIGNS AND SYMPTOMS Symptoms of acute and chronic sinusitis are the same. The primary symptoms are pain and pressure around the affected sinuses. Other symptoms include:  Upper toothache.  Earache.  Headache.  Bad breath.  Decreased sense of smell and taste.  A cough, which worsens when you are lying flat.  Fatigue.  Fever.  Thick drainage from your nose, which often is green and may contain pus (purulent).  Swelling and warmth over the affected sinuses. DIAGNOSIS Your health care provider will perform a physical exam. During your exam, your health care provider may perform any of the following to help determine if you have acute sinusitis or chronic sinusitis:  Look in your nose for signs  of abnormal growths in your nostrils (nasal polyps).  Tap over the affected sinus to check for signs of infection.  View the inside of your sinuses using an imaging device that has a light attached (endoscope). If your health care provider suspects that you have chronic sinusitis, one or more of the following tests may be recommended:  Allergy tests.  Nasal culture. A sample of mucus is taken from your nose, sent to a lab, and screened for bacteria.  Nasal cytology. A sample of mucus is taken from your nose and examined by your health care provider to determine  if your sinusitis is related to an allergy. TREATMENT Most cases of acute sinusitis are related to a viral infection and will resolve on their own within 10 days. Sometimes, medicines are prescribed to help relieve symptoms of both acute and chronic sinusitis. These may include pain medicines, decongestants, nasal steroid sprays, or saline sprays. However, for sinusitis related to a bacterial infection, your health care provider will prescribe antibiotic medicines. These are medicines that will help kill the bacteria causing the infection. Rarely, sinusitis is caused by a fungal infection. In these cases, your health care provider will prescribe antifungal medicine. For some cases of chronic sinusitis, surgery is needed. Generally, these are cases in which sinusitis recurs more than 3 times per year, despite other treatments. HOME CARE INSTRUCTIONS  Drink plenty of water. Water helps thin the mucus so your sinuses can drain more easily.  Use a humidifier.  Inhale steam 3-4 times a day (for example, sit in the bathroom with the shower running).  Apply a warm, moist washcloth to your face 3-4 times a day, or as directed by your health care provider.  Use saline nasal sprays to help moisten and clean your sinuses.  Take medicines only as directed by your health care provider.  If you were prescribed either an antibiotic or antifungal medicine, finish it all even if you start to feel better. SEEK IMMEDIATE MEDICAL CARE IF:  You have increasing pain or severe headaches.  You have nausea, vomiting, or drowsiness.  You have swelling around your face.  You have vision problems.  You have a stiff neck.  You have difficulty breathing.   This information is not intended to replace advice given to you by your health care provider. Make sure you discuss any questions you have with your health care provider.   Document Released: 11/19/2005 Document Revised: 12/10/2014 Document Reviewed:  12/04/2011 Elsevier Interactive Patient Education [employer]

## 2016-04-13 NOTE — ED Provider Notes (Signed)
CSN: [id number]     [id number] date & time 04/13/16  1811 History   First MD Initiated Contact with Patient 04/13/16 2234     Chief Complaint  Patient presents with  . Cough  . Dysuria     (Consider location/radiation/quality/duration/timing/severity/associated sxs/prior Treatment) Patient is a 55 y.o. male presenting with cough and dysuria.  Cough Cough characteristics:  Productive Sputum characteristics:  Manson PasseyBrown Severity:  Severe Onset quality:  Gradual Duration:  8 weeks Timing:  Intermittent Progression:  Improving Chronicity:  Recurrent Smoker: yes   Context: exposure to allergens, smoke exposure, upper respiratory infection and weather changes   Context: not sick contacts  Activity: dysuria.   Relieved by:  Nothing Worsened by:  Environmental changes Associated symptoms: rhinorrhea and sinus congestion   Associated symptoms: no chest pain, no chills, no diaphoresis, no ear fullness, no ear pain, no eye discharge, no fever, no headaches, no myalgias, no rash, no shortness of breath, no sore throat, no weight loss and no wheezing   Risk factors: no chemical exposure, no recent infection and no recent travel   Dysuria This is a new problem. The current episode started in the past 7 days. The problem occurs constantly. The problem has been gradually improving. Associated symptoms include coughing and urinary symptoms. Pertinent negatives include no chest pain, chills, diaphoresis, fever, headaches, myalgias, rash or sore throat.       Past Medical History  Diagnosis Date  . Sinus congestion   . Allergy    History reviewed. No pertinent past surgical history. Family History  Problem Relation Age of Onset  . Hyperlipidemia Father    Social History  Substance Use Topics  . Smoking status: Never Smoker   . Smokeless tobacco: None  . Alcohol Use: No    Review of Systems  Constitutional: Negative for fever, chills, weight loss and diaphoresis.  HENT: Positive for  rhinorrhea. Negative for ear pain and sore throat.   Eyes: Negative for discharge.  Respiratory: Positive for cough. Negative for shortness of breath and wheezing.   Cardiovascular: Negative for chest pain.  Genitourinary: Positive for dysuria.  Musculoskeletal: Negative for myalgias.  Skin: Negative for rash.  Neurological: Negative for headaches.      Allergies  Review of patient's allergies indicates no known allergies.  Home Medications   Prior to Admission medications   Medication Sig Start Date End Date Taking? Authorizing Provider  Diphenhydramine-Acetaminophen (SEVERE ALLERGY PO) Take 1 tablet by mouth every 8 (eight) hours as needed (allergies).    Yes Historical Provider, MD  fluticasone (FLONASE) 50 MCG/ACT nasal spray Place 1 spray into both nostrils daily. Patient not taking: Reported on 04/13/2016 11/14/15   Samantha Tripp Dowless, PA-C  loratadine (CLARITIN) 10 MG tablet Take 1 tablet (10 mg total) by mouth daily. Patient not taking: Reported on 04/13/2016 09/14/14   Mercedes Camprubi-Soms, PA-C  naproxen (NAPROSYN) 500 MG tablet Take 1 tablet (500 mg total) by mouth 2 (two) times daily as needed for mild pain, moderate pain or headache (TAKE WITH MEALS.). Patient not taking: Reported on 04/13/2016 11/14/15   Samantha Tripp Dowless, PA-C  pseudoephedrine (SUDAFED) 30 MG tablet Take 1 tablet (30 mg total) by mouth every 4 (four) hours as needed for congestion. Patient not taking: Reported on 04/13/2016 11/14/15   Samantha Tripp Dowless, PA-C   BP 144/101 mmHg  Pulse 63  Temp(Src) 97.9 F (36.6 C) (Oral)  Resp 19  SpO2 100% Physical Exam  Constitutional: He is oriented to person, place, and  time. He appears well-developed and well-nourished. No distress.  HENT:  Head: Normocephalic and atraumatic.  Nose: Mucosal edema present. No rhinorrhea. Right sinus exhibits maxillary sinus tenderness. Right sinus exhibits no frontal sinus tenderness. Left sinus exhibits maxillary  sinus tenderness. Left sinus exhibits no frontal sinus tenderness.  Mouth/Throat: Oropharynx is clear and moist. No oropharyngeal exudate.  Eyes: Conjunctivae and EOM are normal. Pupils are equal, round, and reactive to light. Right eye exhibits no discharge. Left eye exhibits no discharge. No scleral icterus.  Neck: Normal range of motion. No JVD present. No tracheal deviation present. No thyromegaly present.  Cardiovascular: Normal rate, regular rhythm, normal heart sounds and intact distal pulses.  Exam reveals no gallop and no friction rub.   No murmur heard. Pulmonary/Chest: Effort normal and breath sounds normal. No respiratory distress. He has no wheezes. He has no rales. He exhibits no tenderness.  Abdominal: Soft. Bowel sounds are normal. He exhibits no distension and no mass. There is no tenderness. There is no rebound and no guarding.  Musculoskeletal: Normal range of motion. He exhibits no edema or tenderness.  Lymphadenopathy:    He has no cervical adenopathy.  Neurological: He is alert and oriented to person, place, and time. He has normal reflexes. No cranial nerve deficit. He exhibits normal muscle tone. Coordination normal.  Skin: Skin is warm and dry. No rash noted. He is not diaphoretic. No erythema. No pallor.  Psychiatric: He has a normal mood and affect. His behavior is normal. Judgment and thought content normal.  Nursing note and vitals reviewed.   ED Course  Procedures (including critical care time)   Labs Review Labs Reviewed  URINALYSIS, ROUTINE W REFLEX MICROSCOPIC (NOT AT [facility]) - Abnormal; Notable for the following:    Hgb urine dipstick SMALL (*)    All other components within normal limits  URINE MICROSCOPIC-ADD ON    Imaging Review Dg Chest 2 View  04/13/2016  CLINICAL DATA:  Cough, allergy symptoms for 1 month EXAM: CHEST  2 VIEW COMPARISON:  09/25/2012 FINDINGS: Cardiomediastinal silhouette is stable. No acute infiltrate or pleural effusion. No  pulmonary edema. Bony thorax is unremarkable. IMPRESSION: No active cardiopulmonary disease. Electronically Signed   By: Natasha Mead M.D.   On: 04/13/2016 19:14   I have personally reviewed and evaluated these images and lab results as part of my medical decision-making.   EKG Interpretation None      MDM   Pt with sinusitis x several months with long hx of same, also complains of 2 months of cough with productive sputum, both gradually improving.   CXR negative for PNA, will treat for bronchitis.  Nasal mucosa edematous, not currently treated, recommended tx with OTC medication, Rx given for zyrtec, flonase and singulair (given his severe and recurrent hx of sinusitis).  Pt also given doxycycline because it will cover both COPD exacerbation and sinusitis.   Pt complained of dysuria, which is also a common complaint for him, UA negative.  Pt discharged in good condition, no respiratory distress, no other sx including no CP  Final diagnoses:  Cough  Maxillary sinusitis, unspecified chronicity        Danelle Berry, PA-C 04/25/16 2113  Lorre Nick, MD 04/27/16 [phone]

## 2016-04-13 NOTE — ED Notes (Signed)
Pt x allergy symptoms x 1 month.  Cough/congestion.  Pt also with problems with urination, burning 3-4 days ago.  No fever

## 2016-04-13 NOTE — ED Notes (Signed)
Patient c/o sinus pressure and sore throat.  Patient also c/o painful urination, denies back pain, denies noticeable blood in urine.  NAD at this time.

## 2016-04-14 NOTE — ED Notes (Signed)
Patient d/c's self care.  F/U and medications discussed.  Patient verbalized understanding.

## 2016-12-21 ENCOUNTER — Emergency Department (HOSPITAL_COMMUNITY): Payer: Self-pay

## 2016-12-21 ENCOUNTER — Emergency Department (HOSPITAL_COMMUNITY)
Admission: EM | Admit: 2016-12-21 | Discharge: 2016-12-22 | Disposition: A | Discharge status: Home / Self Care | Payer: Self-pay | Attending: Emergency Medicine | Admitting: Emergency Medicine

## 2016-12-21 ENCOUNTER — Encounter (HOSPITAL_COMMUNITY): Payer: Self-pay

## 2016-12-21 DIAGNOSIS — J329 Chronic sinusitis, unspecified: Secondary | ICD-10-CM | POA: Insufficient documentation

## 2016-12-21 HISTORY — DX: Zoster without complications: B02.9

## 2016-12-21 LAB — CBC
HCT: 44.6 % (ref 39.0–52.0)
Hemoglobin: 15.6 g/dL (ref 13.0–17.0)
MCH: 29.8 pg (ref 26.0–34.0)
MCHC: 35 g/dL (ref 30.0–36.0)
MCV: 85.1 fL (ref 78.0–100.0)
PLATELETS: 160 10*3/uL (ref 150–400)
RBC: 5.24 MIL/uL (ref 4.22–5.81)
RDW: 13 % (ref 11.5–15.5)
WBC: 4.6 10*3/uL (ref 4.0–10.5)

## 2016-12-21 LAB — URINALYSIS, ROUTINE W REFLEX MICROSCOPIC
Bacteria, UA: NONE SEEN
Bilirubin Urine: NEGATIVE
Glucose, UA: NEGATIVE mg/dL
KETONES UR: NEGATIVE mg/dL
Leukocytes, UA: NEGATIVE
Nitrite: NEGATIVE
PH: 5 (ref 5.0–8.0)
Protein, ur: NEGATIVE mg/dL
Specific Gravity, Urine: 1.025 (ref 1.005–1.030)

## 2016-12-21 LAB — COMPREHENSIVE METABOLIC PANEL
ALT: 23 U/L (ref 17–63)
AST: 28 U/L (ref 15–41)
Albumin: 4.4 g/dL (ref 3.5–5.0)
Alkaline Phosphatase: 57 U/L (ref 38–126)
Anion gap: 5 (ref 5–15)
BUN: 13 mg/dL (ref 6–20)
CHLORIDE: 103 mmol/L (ref 101–111)
CO2: 27 mmol/L (ref 22–32)
CREATININE: 1.09 mg/dL (ref 0.61–1.24)
Calcium: 9.3 mg/dL (ref 8.9–10.3)
GFR calc Af Amer: >60 mL/min (ref >60)
GFR calc non Af Amer: >60 mL/min (ref >60)
Glucose, Bld: 83 mg/dL (ref 65–99)
Potassium: 4 mmol/L (ref 3.5–5.1)
SODIUM: 135 mmol/L (ref 135–145)
Total Bilirubin: 0.6 mg/dL (ref 0.3–1.2)
Total Protein: 7.7 g/dL (ref 6.5–8.1)

## 2016-12-21 LAB — LIPASE, BLOOD: Lipase: 26 U/L (ref 11–51)

## 2016-12-21 MED ORDER — FLUTICASONE PROPIONATE 50 MCG/ACT NA SUSP
2.0000 | Freq: Once | NASAL | Status: AC
  Administered 2016-12-21: 2 via NASAL
  Filled 2016-12-21: qty 16

## 2016-12-21 MED ORDER — LORATADINE 10 MG PO TABS
10.0000 mg | ORAL_TABLET | Freq: Once | ORAL | Status: AC
  Administered 2016-12-21: 10 mg via ORAL
  Filled 2016-12-21: qty 1

## 2016-12-21 MED ORDER — AMOXICILLIN 500 MG PO CAPS: 1000.0000 mg | ORAL_CAPSULE | Freq: Two times a day (BID) | ORAL | 0 refills | Status: DC

## 2016-12-21 MED ORDER — ALBUTEROL SULFATE HFA 108 (90 BASE) MCG/ACT IN AERS
2.0000 | INHALATION_SPRAY | Freq: Once | RESPIRATORY_TRACT | Status: AC
Start: 2016-12-21 — End: 2016-12-21
  Administered 2016-12-21: 2 via RESPIRATORY_TRACT
  Filled 2016-12-21: qty 6.7

## 2016-12-21 MED ORDER — AMOXICILLIN 500 MG PO CAPS
1000.0000 mg | ORAL_CAPSULE | Freq: Once | ORAL | Status: AC
  Administered 2016-12-21: 1000 mg via ORAL
  Filled 2016-12-21: qty 2

## 2016-12-21 NOTE — ED Triage Notes (Addendum)
Pt c/o URI symptoms (cough, congestion, sinus pressure and bilateral ear pain) and "a little" diarrhea x "about 2 weeks."  Pain score 10/10.  Pt reports taking OTC medication w/o relief.

## 2016-12-21 NOTE — Discharge Instructions (Signed)
Your chest x-ray today does not show any infection inside of the lungs. I have asked our case manager to call you to try to help you set up primary care. Your blood pressure today is elevated, try to reduce salt and please have this rechecked in the next week at primary care if possible.  You can use Flonase nasal spray which is avilable over the counter. Use nasal saline (you can try [employer] Simply Saline) at least 4 times a day, use saline 5-10 minutes before using the fluticasone (flonase) nasal spray  Do not use Afrin (Oxymetazoline)  Rest, wash hands frequently  and drink plenty of water.  You may try over the counter medication such as allegra-decongestant or claritin-decongestant and/or Mucinex or decongestants.  Push fluids to try to thin the mucus and get it to flow out the nose.   Do not hesitate to return to the emergency room for any new, worsening or concerning symptoms.  Please obtain primary care using resource guide below. Let them know that you were seen in the emergency room and that they will need to obtain records for further outpatient management.

## 2016-12-22 NOTE — ED Provider Notes (Signed)
[facility]-EMERGENCY DEPT Provider Note   CSN: [id number] Arrival date & time: 12/21/16  1750     History   Chief Complaint Chief Complaint  Patient presents with  . URI  . Diarrhea    HPI   Blood pressure 129/78, pulse 92, temperature 99 F (37.2 C), temperature source Oral, resp. rate 20, weight 90 kg, SpO2 99 %.  Ricardo [name] is a 56 y.o. male complaining of rhinorrhea, productive cough, otalgia, irritated eyes and sinus pressure onset 2 weeks ago. He denies fever, chills, chest pain but he says that he sometimes has wheezing when he wakes up in the morning. Reports slightly looser than normal stools with no emesis or abdominal pain. Uninsured and he has no primary care. He's been taking over-the-counter allergy medications with little relief.  Past Medical History:  Diagnosis Date  . Allergy   . Shingles   . Sinus congestion     Patient Active Problem List   Diagnosis Date Noted  . Seasonal allergies 10/31/2011    History reviewed. No pertinent surgical history.     Home Medications    Prior to Admission medications   Medication Sig Start Date End Date Taking? Authorizing Provider  fluticasone (FLONASE) 50 MCG/ACT nasal spray Place 2 sprays into both nostrils daily. 04/13/16  Yes Danelle BerryLeisa Tapia, PA-C  loratadine (CLARITIN) 10 MG tablet Take 1 tablet (10 mg total) by mouth daily. 09/14/14  Yes Mercedes [address], PA-C  amoxicillin (AMOXIL) 500 MG capsule Take 2 capsules (1,000 mg total) by mouth 2 (two) times daily. 12/21/16   Dominyk Law, PA-C  benzonatate (TESSALON) 100 MG capsule Take 2 capsules (200 mg total) by mouth 2 (two) times daily as needed for cough. Patient not taking: Reported on 12/22/2016 04/13/16   Danelle BerryLeisa Tapia, PA-C  cetirizine (ZYRTEC ALLERGY) 10 MG tablet Take 1 tablet (10 mg total) by mouth daily. Patient not taking: Reported on 12/22/2016 04/13/16   Danelle BerryLeisa Tapia, PA-C  doxycycline (VIBRAMYCIN) 100 MG capsule Take 1 capsule (100 mg total) by  mouth 2 (two) times daily. Patient not taking: Reported on 12/22/2016 04/13/16   Danelle BerryLeisa Tapia, PA-C  montelukast (SINGULAIR) 10 MG tablet Take 1 tablet (10 mg total) by mouth at bedtime. Patient not taking: Reported on 12/22/2016 04/13/16   Danelle BerryLeisa Tapia, PA-C  naproxen (NAPROSYN) 500 MG tablet Take 1 tablet (500 mg total) by mouth 2 (two) times daily as needed for mild pain, moderate pain or headache (TAKE WITH MEALS.). Patient not taking: Reported on 04/13/2016 11/14/15   Samantha Tripp Dowless, PA-C  predniSONE (DELTASONE) 20 MG tablet Take 2 tablets (40 mg total) by mouth daily. Patient not taking: Reported on 12/22/2016 04/13/16   Danelle BerryLeisa Tapia, PA-C  pseudoephedrine (SUDAFED) 30 MG tablet Take 1 tablet (30 mg total) by mouth every 4 (four) hours as needed for congestion. Patient not taking: Reported on 04/13/2016 11/14/15   Dub MikesSamantha Tripp Dowless, PA-C    Family History Family History  Problem Relation Age of Onset  . Hyperlipidemia Father     Social History Social History  Substance Use Topics  . Smoking status: Never Smoker  . Smokeless tobacco: Never Used  . Alcohol use No     Allergies   Patient has no known allergies.   Review of Systems Review of Systems  10 systems reviewed and found to be negative, except as noted in the HPI.   Physical Exam Updated Vital Signs BP 143/91 (BP Location: Left Arm)   Pulse 88   Temp  99 F (37.2 C) (Oral)   Resp 19   Wt 90 kg   SpO2 100%   BMI 30.18 kg/m   Physical Exam  Constitutional: He appears well-developed and well-nourished.  HENT:  Head: Normocephalic.  Right Ear: External ear normal.  Left Ear: External ear normal.  Mouth/Throat: Oropharynx is clear and moist. No oropharyngeal exudate.  Significant edema to bilateral nares. Positive tenderness to palpation of maxillary sinuses.   No drooling or stridor. Posterior pharynx mildly erythematous no significant tonsillar hypertrophy. No exudate. Soft palate rises  symmetrically. No TTP or induration under tongue.   Left TM is retracted, right TM can't be visualized secondary to cerumen  Eyes: Conjunctivae and EOM are normal. Pupils are equal, round, and reactive to light.  Neck: Normal range of motion. Neck supple.  Cardiovascular: Normal rate and regular rhythm.   Pulmonary/Chest: Effort normal and breath sounds normal. No stridor. No respiratory distress. He has no wheezes. He has no rales. He exhibits no tenderness.  Abdominal: Soft. There is no tenderness. There is no rebound and no guarding.  Nursing note and vitals reviewed.    ED Treatments / Results  Labs (all labs ordered are listed, but only abnormal results are displayed) Labs Reviewed  URINALYSIS, ROUTINE W REFLEX MICROSCOPIC - Abnormal; Notable for the following:       Result Value   Hgb urine dipstick MODERATE (*)    Squamous Epithelial / LPF 0-5 (*)    All other components within normal limits  LIPASE, BLOOD  COMPREHENSIVE METABOLIC PANEL  CBC    EKG  EKG Interpretation None       Radiology Dg Chest 2 View  Result Date: 12/21/2016 CLINICAL DATA:  Productive cough and congestion EXAM: CHEST  2 VIEW COMPARISON:  04/13/2016 FINDINGS: Linear atelectasis or scarring at the left base. No acute consolidation or effusion. Cardiomediastinal silhouette within normal limits. No pneumothorax. IMPRESSION: No radiographic evidence for acute cardiopulmonary abnormality Electronically Signed   By: Jasmine Pang M.D.   On: 12/21/2016 23:50    Procedures Procedures (including critical care time)  Medications Ordered in ED Medications  fluticasone (FLONASE) 50 MCG/ACT nasal spray 2 spray (2 sprays Each Nare Given 12/21/16 2352)  loratadine (CLARITIN) tablet 10 mg (10 mg Oral Given 12/21/16 2352)  amoxicillin (AMOXIL) capsule 1,000 mg (1,000 mg Oral Given 12/21/16 2352)  albuterol (PROVENTIL HFA;VENTOLIN HFA) 108 (90 Base) MCG/ACT inhaler 2 puff (2 puffs Inhalation Given 12/21/16 2352)      Initial Impression / Assessment and Plan / ED Course  I have reviewed the triage vital signs and the nursing notes.  Pertinent labs & imaging results that were available during my care of the patient were reviewed by me and considered in my medical decision making (see chart for details).     Vitals:   12/21/16 1806 12/21/16 2016 12/21/16 2237 12/21/16 2352  BP: (!) 135/102 138/97 129/78 143/91  Pulse: 104 102 92 88  Resp: 16 20 20 19   Temp: 98.3 F (36.8 C) 99 F (37.2 C)    TempSrc: Oral Oral    SpO2: 98% 100% 99% 100%  Weight:  90 kg      Medications  fluticasone (FLONASE) 50 MCG/ACT nasal spray 2 spray (2 sprays Each Nare Given 12/21/16 2352)  loratadine (CLARITIN) tablet 10 mg (10 mg Oral Given 12/21/16 2352)  amoxicillin (AMOXIL) capsule 1,000 mg (1,000 mg Oral Given 12/21/16 2352)  albuterol (PROVENTIL HFA;VENTOLIN HFA) 108 (90 Base) MCG/ACT inhaler 2 puff (2 puffs  Inhalation Given 12/21/16 2352)    BAYAN KUSHNIR is 56 y.o. male presenting withWeeks of nasal congestion, sinus pressure pain, eye irritation. Tender to palpation along the sinuses, we'll treat for sinusitis with amoxicillin. Also recommended that he take allergy medications. He is reporting productive cough however lung sounds are clear, will check a chest x-ray.  Chest x-rays without infiltrate. Case management consulted to establish primary care. Blood pressure is elevated, he has hemoglobin in the urine, could be a secondary to untreated hypertension. Advised him to follow closely with primary care.  Evaluation does not show pathology that would require ongoing emergent intervention or inpatient treatment. Pt is hemodynamically stable and mentating appropriately. Discussed findings and plan with patient/guardian, who agrees with care plan. All questions answered. Return precautions discussed and outpatient follow up given.      Final Clinical Impressions(s) / ED Diagnoses   Final diagnoses:   Sinusitis, unspecified chronicity, unspecified location    New Prescriptions New Prescriptions   AMOXICILLIN (AMOXIL) 500 MG CAPSULE    Take 2 capsules (1,000 mg total) by mouth 2 (two) times daily.     Wynetta Emery, PA-C 12/22/16 0006    Dione Booze, MD 12/22/16 [phone]

## 2016-12-24 ENCOUNTER — Telehealth: Payer: Self-pay | Admitting: *Deleted

## 2016-12-24 ENCOUNTER — Encounter: Payer: Self-pay | Admitting: Gastroenterology

## 2017-01-22 ENCOUNTER — Encounter: Payer: Self-pay | Admitting: Gastroenterology

## 2017-03-04 ENCOUNTER — Encounter: Payer: Self-pay | Admitting: Gastroenterology

## 2017-03-19 ENCOUNTER — Ambulatory Visit (AMBULATORY_SURGERY_CENTER): Payer: Self-pay | Admitting: *Deleted

## 2017-03-19 VITALS — Ht 67.0 in | Wt 198.0 lb

## 2017-03-19 DIAGNOSIS — Z1211 Encounter for screening for malignant neoplasm of colon: Secondary | ICD-10-CM

## 2017-03-19 MED ORDER — NA SULFATE-K SULFATE-MG SULF 17.5-3.13-1.6 GM/177ML PO SOLN: 1.0000 | Freq: Once | ORAL | 0 refills | Status: AC

## 2017-03-19 NOTE — Progress Notes (Signed)
No egg or soy allergy known to patient  No ipast sedation, no past  intubation problems  No diet pills per patient No home 02 use per patient  No blood thinners per patient  Pt denies issues with constipation  No A fib or A flutter  emmi video to pt's e mail 15$ coupon to pt for suprep

## 2017-03-20 ENCOUNTER — Encounter: Payer: Self-pay | Admitting: Gastroenterology

## 2017-03-26 ENCOUNTER — Telehealth: Payer: Self-pay | Admitting: Gastroenterology

## 2017-03-26 ENCOUNTER — Encounter: Payer: Self-pay | Admitting: Gastroenterology

## 2017-03-26 NOTE — Telephone Encounter (Signed)
No that's okay but please reschedule him, if the same thing happens again he will be charged. Thanks

## 2017-03-28 NOTE — Telephone Encounter (Signed)
Left message for patient to call back and reschedule appointment.

## 2017-05-20 ENCOUNTER — Encounter: Payer: Self-pay | Admitting: Gastroenterology

## 2017-05-20 ENCOUNTER — Encounter: Payer: BLUE CROSS/BLUE SHIELD | Admitting: Gastroenterology

## 2017-05-20 ENCOUNTER — Telehealth: Payer: Self-pay | Admitting: Gastroenterology

## 2017-05-20 NOTE — Telephone Encounter (Signed)
Returned patient's call to his home phone and no answer.  Tried to call his cell and no answer?  L/M on voicemail  And advised patient to come on here at 1 pm that it is normal for to be still going to restroom. Advised him to take measures such as a diaper or towels in case of an accident.  Gave admitting's direct number for him to call back if he had anymore concerns.

## 2017-05-27 ENCOUNTER — Encounter (HOSPITAL_COMMUNITY): Payer: Self-pay | Admitting: Emergency Medicine

## 2017-05-27 ENCOUNTER — Emergency Department (HOSPITAL_COMMUNITY)
Admission: EM | Admit: 2017-05-27 | Discharge: 2017-05-27 | Disposition: A | Discharge status: Home / Self Care | Payer: BLUE CROSS/BLUE SHIELD | Attending: Emergency Medicine | Admitting: Emergency Medicine

## 2017-05-27 DIAGNOSIS — J069 Acute upper respiratory infection, unspecified: Secondary | ICD-10-CM

## 2017-05-27 DIAGNOSIS — J320 Chronic maxillary sinusitis: Secondary | ICD-10-CM | POA: Diagnosis not present

## 2017-05-27 DIAGNOSIS — R05 Cough: Secondary | ICD-10-CM

## 2017-05-27 DIAGNOSIS — J301 Allergic rhinitis due to pollen: Secondary | ICD-10-CM | POA: Diagnosis not present

## 2017-05-27 DIAGNOSIS — Z79899 Other long term (current) drug therapy: Secondary | ICD-10-CM | POA: Insufficient documentation

## 2017-05-27 DIAGNOSIS — R059 Cough, unspecified: Secondary | ICD-10-CM

## 2017-05-27 DIAGNOSIS — R0981 Nasal congestion: Secondary | ICD-10-CM | POA: Diagnosis present

## 2017-05-27 MED ORDER — ALBUTEROL SULFATE HFA 108 (90 BASE) MCG/ACT IN AERS: 1.0000 | INHALATION_SPRAY | Freq: Four times a day (QID) | RESPIRATORY_TRACT | 0 refills | Status: DC | PRN

## 2017-05-27 MED ORDER — ALBUTEROL SULFATE HFA 108 (90 BASE) MCG/ACT IN AERS
2.0000 | INHALATION_SPRAY | Freq: Once | RESPIRATORY_TRACT | Status: AC
  Administered 2017-05-27: 2 via RESPIRATORY_TRACT
  Filled 2017-05-27: qty 6.7

## 2017-05-27 NOTE — ED Triage Notes (Signed)
Pt reports sinus pain/congestion, dry cough, and ear fullness for over three weeks.  Afebrile.  A&Ox4.  Ambulatory.  Denies CP or SOB.

## 2017-05-27 NOTE — Discharge Instructions (Signed)
Continue to stay well-hydrated. Gargle warm salt water and spit it out. Use chloraseptic spray as needed for sore throat. Continue to alternate between Tylenol and Ibuprofen for pain or fever. Use Mucinex for cough suppression/expectoration of mucus. Use netipot and flonase to help with nasal congestion. Use daily over-the-counter antihistamines like claritin/zyrtec/etc to decrease secretions and for help with your symptoms. Use inhaler as directed, as needed for cough/chest congestion/wheezing/shortness of breath. Follow up with your primary care doctor in 5-7 days for recheck of ongoing symptoms. Return to emergency department for emergent changing or worsening of symptoms.

## 2017-05-27 NOTE — ED Provider Notes (Signed)
[facility]-EMER[facility]Y DEPT Provider Note   CSN: [id number] Arrival date & time: 05/27/17  1658  By signing my name below, I, Modena JanskyAlbert Thayil, attest that this documentation has been prepared under the direction and in the presence of non-physician practitioner, [address] Eyanna Mcgonagle Deziah Renwick, PA-C. Electronically Signed: Modena JanskyAlbert Thayil, Scribe. 05/27/2017. 5:55 PM.  History   Chief Complaint Chief Complaint  Patient presents with  . Nasal Congestion  . Facial Pain  . Croup   The history is provided by the patient and medical records. No language interpreter was used.  URI   This is a recurrent problem. The current episode started more than 1 week ago. The problem has not changed since onset.There has been no fever. Associated symptoms include congestion, ear pain (fullness), plugged ear sensation, rhinorrhea, sinus pain, sore throat (scratchy), cough and wheezing. Pertinent negatives include no chest pain, no abdominal pain, no diarrhea, no nausea, no vomiting, no dysuria and no rash. He has tried other medications (sudafed) for the symptoms. The treatment provided mild relief.    HPI Comments: Ricardo [name] is a 56 y.o. male with a PMHx of seasonal allergies and chronic sinusitis, who presents to the Emergency Department complaining of constant moderate nasal congestion and seasonal allergy symptoms that started ~3-4 weeks ago. Symptoms include nasal congestion, rhinorrhea, face/sinus pain, ear fullness/pain, cough with yellow sputum production, wheezing, scratchy throat, and generalized body aches. Symptoms exacerbated by outdoor allergens, and unrelieved by Sudafed. He has prior hx of similar complaint due to seasonal allergies. No daily anti-histamine use; denies any sick contacts, and he is a nonsmoker. He denies fevers, chills, ear drainage, difficulty swallowing, drooling, trismus, CP, SOB, abd pain, N/V/D/C, rashes, hematuria, dysuria, arthralgias, numbness, tingling, focal weakness, or any other complaints at  this time. He is also currently out of his albuterol inhaler and is requesting a refill.    Past Medical History:  Diagnosis Date  . Allergy   . Shingles   . Sinus congestion     Patient Active Problem List   Diagnosis Date Noted  . Seasonal allergies 10/31/2011    Past Surgical History:  Procedure Laterality Date  . NO PAST SURGERIES         Home Medications    Prior to Admission medications   Medication Sig Start Date End Date Taking? Authorizing Provider  albuterol (PROVENTIL HFA;VENTOLIN HFA) 108 (90 Base) MCG/ACT inhaler Inhale 1-2 puffs into the lungs every 6 (six) hours as needed for wheezing or shortness of breath (or cough/congestion/etc). 05/27/17   Alysha Doolan, PA-C  COD LIVER OIL PO Take by mouth daily.    [provider]  fluticasone (FLONASE) 50 MCG/ACT nasal spray Place 2 sprays into both nostrils daily. 04/13/16   Danelle Berryapia, Leisa, PA-C  Multiple Vitamin (MULTIVITAMIN) tablet Take 1 tablet by mouth daily.    [provider]  Omega-3 Fatty Acids (FISH OIL) 1000 MG CAPS Take by mouth.    [provider]  pseudoephedrine (SUDAFED) 30 MG tablet Take 1 tablet (30 mg total) by mouth every 4 (four) hours as needed for congestion. 11/14/15   Dowless, Lester KinsmanSamantha Tripp, PA-C    Family History Family History  Problem Relation Age of Onset  . Hyperlipidemia Father   . Colon cancer Neg Hx   . Colon polyps Neg Hx   . Esophageal cancer Neg Hx   . Rectal cancer Neg Hx   . Stomach cancer Neg Hx     Social History Social History  Substance Use Topics  . Smoking  status: Never Smoker  . Smokeless tobacco: Never Used  . Alcohol use No     Allergies   Patient has no known allergies.   Review of Systems Review of Systems  Constitutional: Negative for chills and fever.  HENT: Positive for congestion, ear pain (fullness), rhinorrhea, sinus pain and sore throat (scratchy). Negative for drooling, ear discharge and trouble swallowing.     Respiratory: Positive for cough and wheezing. Negative for shortness of breath.   Cardiovascular: Negative for chest pain.  Gastrointestinal: Negative for abdominal pain, constipation, diarrhea, nausea and vomiting.  Genitourinary: Negative for dysuria and hematuria.  Musculoskeletal: Positive for myalgias (generalized body aches). Negative for arthralgias.  Skin: Negative for color change and rash.  Allergic/Immunologic: Negative for immunocompromised state.  Neurological: Negative for weakness and numbness.  Psychiatric/Behavioral: Negative for confusion.  All other systems reviewed and are negative for acute change except as noted in the HPI.   Physical Exam Updated Vital Signs BP 132/88 (BP Location: Right Arm)   Pulse 85   Temp 98.1 F (36.7 C) (Oral)   Resp 18   SpO2 97%   Physical Exam  Constitutional: He is oriented to person, place, and time. Vital signs are normal. He appears well-developed and well-nourished.  Non-toxic appearance. No distress.  Afebrile, nontoxic, NAD  HENT:  Head: Normocephalic and atraumatic.  Right Ear: Hearing, tympanic membrane, external ear and ear canal normal.  Left Ear: Hearing, tympanic membrane, external ear and ear canal normal.  Nose: Mucosal edema and rhinorrhea present. Right sinus exhibits maxillary sinus tenderness. Right sinus exhibits no frontal sinus tenderness. Left sinus exhibits maxillary sinus tenderness. Left sinus exhibits no frontal sinus tenderness.  Mouth/Throat: Uvula is midline, oropharynx is clear and moist and mucous membranes are normal. No trismus in the jaw. No uvula swelling. Tonsils are 0 on the right. Tonsils are 0 on the left. No tonsillar exudate.  Ears are clear bilaterally. Nose mildly congested, with mild maxillary sinus TTP. Oropharynx clear and moist, without uvular swelling or deviation, no trismus or drooling, no tonsillar swelling or erythema, no exudates.   Eyes: Conjunctivae and EOM are normal. Right eye  exhibits no discharge. Left eye exhibits no discharge.  Neck: Normal range of motion. Neck supple.  Cardiovascular: Normal rate, regular rhythm, normal heart sounds and intact distal pulses.  Exam reveals no gallop and no friction rub.   No murmur heard. Pulmonary/Chest: Effort normal and breath sounds normal. No respiratory distress. He has no decreased breath sounds. He has no wheezes. He has no rhonchi. He has no rales.  CTAB in all lung fields, no w/r/r, no hypoxia or increased WOB, speaking in full sentences, SpO2 97% on RA  Abdominal: Soft. Normal appearance and bowel sounds are normal. He exhibits no distension. There is no tenderness. There is no rigidity, no rebound, no guarding, no CVA tenderness, no tenderness at McBurney's point and negative Murphy's sign.  Musculoskeletal: Normal range of motion.  Neurological: He is alert and oriented to person, place, and time. He has normal strength. No sensory deficit.  Skin: Skin is warm, dry and intact. No rash noted.  Psychiatric: He has a normal mood and affect.  Nursing note and vitals reviewed.    ED Treatments / Results  DIAGNOSTIC STUDIES: Oxygen Saturation is 97% on RA, normal by my interpretation.    COORDINATION OF CARE: 5:59 PM- Pt advised of plan for treatment and pt agrees.  Labs (all labs ordered are listed, but only abnormal results are displayed)  Labs Reviewed - No data to display  EKG  EKG Interpretation None       Radiology No results found.  Procedures Procedures (including critical care time)  Medications Ordered in ED Medications  albuterol (PROVENTIL HFA;VENTOLIN HFA) 108 (90 Base) MCG/ACT inhaler 2 puff (2 puffs Inhalation Given 05/27/17 1817)     Initial Impression / Assessment and Plan / ED Course  I have reviewed the triage vital signs and the nursing notes.  Pertinent labs & imaging results that were available during my care of the patient were reviewed by me and considered in my medical  decision making (see chart for details).     56 y.o. male here with recurrent sinusitis symptoms, and seasonal allergy symptoms x3-4wks. On exam, clear lungs, no wheezing, mild nasal congestion, ears clear, throat clear. Overall well appearing, afebrile and nontoxic. Mild maxillary sinus tenderness. Appears this is a frequent recurrent issue, and he comes to the ED for this issue on a seasonal basis. Likely allergic in nature, vs possible viral superimposed illness, but doubt bacterial etiology. Advised daily antihistamine, flonase/netipot use, and other OTC remedies for symptomatic relief. Pt requesting inhaler, will give him one here and then send home with rx for refill. Doubt need for labs/imaging. F/up with PCP in 1-2wks for recheck and ongoing management. I explained the diagnosis and have given explicit precautions to return to the ER including for any other new or worsening symptoms. The patient understands and accepts the medical plan as it's been dictated and I have answered their questions. Discharge instructions concerning home care and prescriptions have been given. The patient is STABLE and is discharged to home in good condition.   I personally performed the services described in this documentation, which was scribed in my presence. The recorded information has been reviewed and is accurate.   Final Clinical Impressions(s) / ED Diagnoses   Final diagnoses:  Seasonal allergic rhinitis due to pollen  Chronic maxillary sinusitis  Upper respiratory tract infection, unspecified type  Cough    New Prescriptions New Prescriptions   ALBUTEROL (PROVENTIL HFA;VENTOLIN HFA) 108 (90 BASE) MCG/ACT INHALER    Inhale 1-2 puffs into the lungs every 6 (six) hours as needed for wheezing or shortness of breath (or cough/congestion/etc).      [address], [place], [place] 05/27/17 1833    Benjiman Core, MD 05/27/17 2350

## 2017-06-27 ENCOUNTER — Ambulatory Visit (AMBULATORY_SURGERY_CENTER): Payer: Self-pay

## 2017-06-27 VITALS — Ht 67.0 in | Wt 202.0 lb

## 2017-06-27 DIAGNOSIS — Z1211 Encounter for screening for malignant neoplasm of colon: Secondary | ICD-10-CM

## 2017-06-27 MED ORDER — NA SULFATE-K SULFATE-MG SULF 17.5-3.13-1.6 GM/177ML PO SOLN: 1.0000 | Freq: Once | ORAL | 0 refills | Status: AC

## 2017-06-27 NOTE — Progress Notes (Signed)
Denies allergies to eggs or soy products. Denies complication of anesthesia or sedation. Denies use of weight loss medication. Denies use of O2.   Emmi instructions declined.  

## 2017-07-08 ENCOUNTER — Encounter: Payer: BLUE CROSS/BLUE SHIELD | Admitting: Gastroenterology

## 2017-08-23 ENCOUNTER — Encounter: Payer: Self-pay | Admitting: Gastroenterology

## 2017-08-23 ENCOUNTER — Telehealth: Payer: Self-pay | Admitting: Gastroenterology

## 2017-08-23 NOTE — Telephone Encounter (Signed)
This is the second time he has cancelled within 24 hours for a procedure. He should be charged a cancellation fee if he does not follow through with his next exam

## 2017-08-28 NOTE — Telephone Encounter (Signed)
Not Billed Colon Cx fee this last time. I called him to see if he wanted to reschedule. No answer so I left a voicemail to call back at his convenience.

## 2017-09-25 ENCOUNTER — Emergency Department (HOSPITAL_COMMUNITY)
Admission: EM | Admit: 2017-09-25 | Discharge: 2017-09-25 | Disposition: A | Discharge status: Home / Self Care | Payer: Self-pay | Attending: Physician Assistant | Admitting: Physician Assistant

## 2017-09-25 ENCOUNTER — Emergency Department (HOSPITAL_COMMUNITY): Payer: Self-pay

## 2017-09-25 DIAGNOSIS — J309 Allergic rhinitis, unspecified: Secondary | ICD-10-CM | POA: Insufficient documentation

## 2017-09-25 DIAGNOSIS — Z79899 Other long term (current) drug therapy: Secondary | ICD-10-CM | POA: Insufficient documentation

## 2017-09-25 DIAGNOSIS — J0100 Acute maxillary sinusitis, unspecified: Secondary | ICD-10-CM | POA: Insufficient documentation

## 2017-09-25 LAB — RAPID STREP SCREEN (MED CTR MEBANE ONLY): STREPTOCOCCUS, GROUP A SCREEN (DIRECT): NEGATIVE

## 2017-09-25 MED ORDER — FLUTICASONE PROPIONATE 50 MCG/ACT NA SUSP: 2.0000 | Freq: Every day | NASAL | 0 refills | Status: DC

## 2017-09-25 MED ORDER — CETIRIZINE HCL 10 MG PO TABS: 10.0000 mg | ORAL_TABLET | Freq: Every day | ORAL | 0 refills | Status: DC

## 2017-09-25 MED ORDER — AMOXICILLIN-POT CLAVULANATE 875-125 MG PO TABS: 1.0000 | ORAL_TABLET | Freq: Two times a day (BID) | ORAL | 0 refills | Status: DC

## 2017-09-25 NOTE — ED Triage Notes (Signed)
Pt is c/o nasal congestion, cough, itchy eyes, head pressure and associated URI symptoms for 3 weeks. Pt was taking sudafed OTC but stated that it did not work. He stated that the symptoms have been intermittent.

## 2017-09-25 NOTE — Discharge Instructions (Signed)
You were seen here today for your symptoms.  Your exam is consistent with allergic rhinitis and sinusitis.  I have attached informational handouts on these.  Please take all of your antibiotics until finished!   You may develop abdominal discomfort or diarrhea from the antibiotic.  You may help offset this with probiotics which you can buy or get in yogurt. Do not eat or take the probiotics until 2 hours after your antibiotic. Do not take your medicine if develop an itchy rash, swelling in your mouth or lips, or difficulty breathing.  Please take allergy medicine (Zyrtec) daily.  Please take nasal steroid as prescribed for at least 2 weeks before benefit will be seen.  These 2 medications will help prevent recurrence of your symptoms.  Please follow-up with your primary care provider in 1 week for reevaluation.  Additional Information:  Your vital signs today were: BP (!) 149/90 (BP Location: Right Arm)    Pulse 76    Temp 98.2 F (36.8 C) (Oral)    Resp 18    Ht 5\' 8"  (1.727 m)    Wt 88 kg (194 lb)    SpO2 100%    BMI 29.50 kg/m  If your blood pressure (BP) was elevated above 135/85 this visit, please have this repeated by your doctor within one month. ---------------

## 2017-09-25 NOTE — ED Provider Notes (Signed)
[facility]-EMERGENCY DEPT Provider Note   CSN: [id number] Arrival date & time: 09/25/17  1534     History   Chief Complaint Chief Complaint  Patient presents with  . URI    HPI Ricardo [name] is a 56 y.o. male with a history of seasonal allergies who presents to the emergency department today for URI like symptoms.  Patient states that over the last 1 month he has had nasal congestion, rhinorrhea, itchy/watery/burning eyes, sinus headaches, sore throat, postnasal drip and productive cough with clear sputum.  He states in the beginning of his symptom onset he had some body aches but this has resolved for approximately 3 weeks.  He has been taking over-the-counter severe allergy relief which with only mild relief. He has now stopped taking this. He denies fever, chills, current myalgias or arthralgia's, night sweats, weight loss, travel, SOB, DOE,  Hemoptysis. No sick contacts. Never smoker. No new medications or use of ACE-I. No relation to food. No history of asthma, COPD, or CHF. Patient is not immunocompromised (HIV, chronic steroid use, etc.).     HPI  Past Medical History:  Diagnosis Date  . Allergy   . Shingles   . Sinus congestion     Patient Active Problem List   Diagnosis Date Noted  . Seasonal allergies 10/31/2011    Past Surgical History:  Procedure Laterality Date  . NO PAST SURGERIES         Home Medications    Prior to Admission medications   Medication Sig Start Date End Date Taking? Authorizing Provider  albuterol (PROVENTIL HFA;VENTOLIN HFA) 108 (90 Base) MCG/ACT inhaler Inhale 1-2 puffs into the lungs every 6 (six) hours as needed for wheezing or shortness of breath (or cough/congestion/etc). 05/27/17   Street, Mercedes, PA-C  COD LIVER OIL PO Take by mouth daily.    [provider]  fluticasone (FLONASE) 50 MCG/ACT nasal spray Place 2 sprays into both nostrils daily. 04/13/16   Danelle Berryapia, Leisa, PA-C  Multiple Vitamin  (MULTIVITAMIN) tablet Take 1 tablet by mouth daily.    [provider]  Omega-3 Fatty Acids (FISH OIL) 1000 MG CAPS Take by mouth.    [provider]  pseudoephedrine (SUDAFED) 30 MG tablet Take 1 tablet (30 mg total) by mouth every 4 (four) hours as needed for congestion. 11/14/15   Dowless, Lester KinsmanSamantha Tripp, PA-C    Family History Family History  Problem Relation Age of Onset  . Hyperlipidemia Father   . Colon cancer Neg Hx   . Colon polyps Neg Hx   . Esophageal cancer Neg Hx   . Rectal cancer Neg Hx   . Stomach cancer Neg Hx     Social History Social History  Substance Use Topics  . Smoking status: Never Smoker  . Smokeless tobacco: Never Used  . Alcohol use No     Allergies   Patient has no known allergies.   Review of Systems Review of Systems  All other systems reviewed and are negative.    Physical Exam Updated Vital Signs BP (!) 149/90 (BP Location: Right Arm)   Pulse 76   Temp 98.2 F (36.8 C) (Oral)   Resp 18   Ht 5\' 8"  (1.727 m)   Wt 88 kg (194 lb)   SpO2 100%   BMI 29.50 kg/m   Physical Exam  Constitutional: He appears well-developed and well-nourished. No distress.  HENT:  Head: Normocephalic and atraumatic.  Right Ear: Hearing, tympanic membrane, external ear and ear  canal normal. No foreign bodies. Tympanic membrane is not injected, not perforated, not erythematous, not retracted and not bulging.  Left Ear: Hearing, tympanic membrane, external ear and ear canal normal. No foreign bodies. Tympanic membrane is not injected, not perforated, not erythematous, not retracted and not bulging.  Nose: Mucosal edema present. No rhinorrhea, sinus tenderness, septal deviation or nasal septal hematoma.  No foreign bodies. Right sinus exhibits maxillary sinus tenderness. Right sinus exhibits no frontal sinus tenderness. Left sinus exhibits maxillary sinus tenderness. Left sinus exhibits no frontal sinus tenderness.  Mouth/Throat: Uvula is  midline, oropharynx is clear and moist and mucous membranes are normal. No tonsillar exudate.  The patient has normal phonation and is in control of secretions. No stridor.  Midline uvula without edema. Soft palate rises symmetrically.  No tonsillar erythema or exudates. No PTA. Cobblestoning. Tongue protrusion is normal. No trismus. No creptius on neck palpation and patient has good dentition. No gingival erythema or fluctuance noted. Mucus membranes moist.  Eyes: Pupils are equal, round, and reactive to light. Conjunctivae, EOM and lids are normal. Right eye exhibits no discharge. Left eye exhibits no discharge. Right conjunctiva is not injected. Left conjunctiva is not injected. No scleral icterus.  Watery eyes  Neck: Trachea normal, normal range of motion, full passive range of motion without pain and phonation normal. Neck supple. No spinous process tenderness and no muscular tenderness present. No neck rigidity. No tracheal deviation and normal range of motion present.  Cardiovascular: Normal rate, regular rhythm and intact distal pulses.   No murmur heard. Pulses:      Radial pulses are 2+ on the right side, and 2+ on the left side.       Dorsalis pedis pulses are 2+ on the right side, and 2+ on the left side.       Posterior tibial pulses are 2+ on the right side, and 2+ on the left side.  No lower extremity swelling or edema. Calves symmetric in size bilaterally.  Pulmonary/Chest: Effort normal and breath sounds normal. No stridor. He has no wheezes. He exhibits no tenderness.  Abdominal: Soft. Bowel sounds are normal. There is no tenderness. There is no rebound and no guarding.  Musculoskeletal: He exhibits no edema.  Lymphadenopathy:       Head (right side): No submental, no submandibular, no tonsillar, no preauricular, no posterior auricular and no occipital adenopathy present.       Head (left side): No submental, no submandibular, no tonsillar, no preauricular, no posterior auricular  and no occipital adenopathy present.    He has no cervical adenopathy.  Neurological: He is alert.  Skin: Skin is warm and dry. No rash noted. He is not diaphoretic.  Psychiatric: He has a normal mood and affect.  Nursing note and vitals reviewed.    ED Treatments / Results  Labs (all labs ordered are listed, but only abnormal results are displayed) Labs Reviewed  RAPID STREP SCREEN (NOT AT [facility])  CULTURE, GROUP A STREP [facility])    EKG  EKG Interpretation None       Radiology Dg Chest 2 View  Result Date: 09/25/2017 CLINICAL DATA:  Cough and congestion for several weeks EXAM: CHEST  2 VIEW COMPARISON:  12/21/2016 FINDINGS: Cardiac shadow is within normal limits. The lungs are well aerated bilaterally. No focal infiltrate or sizable effusion is seen. No acute bony abnormality is noted. IMPRESSION: No active cardiopulmonary disease. Electronically Signed   By: Alcide Clever M.D.   On: 09/25/2017 17:06  Procedures Procedures (including critical care time)  Medications Ordered in ED Medications - No data to display   Initial Impression / Assessment and Plan / ED Course  I have reviewed the triage vital signs and the nursing notes.  Pertinent labs & imaging results that were available during my care of the patient were reviewed by me and considered in my medical decision making (see chart for details).      Patient complaining of symptoms of URI like symptoms assistant with allergic rhinitis.  However the patient does have underlying sinusitis that has been persistent for greater than 14 days.  He has maxillary sinus pain.  Patient will be given Augmentin, nasal steroids and antihistamine.  Do not feel CT is needed at this time as patient has no history of diabetes or other immunocompromised conditions.  Chest x-ray and strep test done in triage reassuring. Rreturn precautions discussed.  Patient is in agreement with plan and will follow with PCP in 1 week.  Patient appears  stable for discharge.  Final Clinical Impressions(s) / ED Diagnoses   Final diagnoses:  Allergic rhinitis, unspecified seasonality, unspecified trigger  Subacute maxillary sinusitis    New Prescriptions New Prescriptions   AMOXICILLIN-CLAVULANATE (AUGMENTIN) 875-125 MG TABLET    Take 1 tablet by mouth 2 (two) times daily.   CETIRIZINE (ZYRTEC) 10 MG TABLET    Take 1 tablet (10 mg total) by mouth daily.   FLUTICASONE (FLONASE) 50 MCG/ACT NASAL SPRAY    Place 2 sprays into both nostrils daily.     Jalal, Rauch, PA-C 09/25/17 1747    Abelino Derrick, MD 09/25/17 [phone]

## 2017-09-25 NOTE — ED Notes (Addendum)
Pt is alert and oriented x 4 and is verbally responsive, Pt is accompanied with spouse.  states that he has nasal congestion, headaches, sore throat, body aches and burning eyes on and off x 2 weeks. Pt states that he took OCT allergy medicine and was a little effective. Pt states that he has had increased coughing episodes with productive clear phlegm.

## 2017-09-28 LAB — CULTURE, GROUP A STREP (THRC)

## 2017-11-06 ENCOUNTER — Telehealth: Payer: Self-pay | Admitting: *Deleted

## 2017-11-06 ENCOUNTER — Ambulatory Visit (AMBULATORY_SURGERY_CENTER): Payer: Self-pay | Admitting: *Deleted

## 2017-11-06 ENCOUNTER — Other Ambulatory Visit: Payer: Self-pay

## 2017-11-06 VITALS — Ht 68.0 in | Wt 201.4 lb

## 2017-11-06 DIAGNOSIS — Z1211 Encounter for screening for malignant neoplasm of colon: Secondary | ICD-10-CM

## 2017-11-06 NOTE — Telephone Encounter (Signed)
Pt did not check in on the 3rd floor for appointment.

## 2017-11-06 NOTE — Telephone Encounter (Signed)
Phone call to patient regarding no show for previsit. LM on home phone that pt would need to reschedule PV before 5 pm today or colonoscopy scheduled for 11/20/17 would be cancelled.   Called cell phone, unable to leave message as voice mail is full.

## 2017-11-06 NOTE — Progress Notes (Signed)
No egg or soy allergy  No trouble moving neck; pt has never had surgery or been intubated  No home oxygen used or hx of sleep apnea  Pt has Suprep at home, so new RX not sent in

## 2017-11-20 ENCOUNTER — Encounter: Payer: Self-pay | Admitting: Gastroenterology

## 2017-11-20 ENCOUNTER — Telehealth: Payer: Self-pay | Admitting: Gastroenterology

## 2017-11-20 NOTE — Telephone Encounter (Signed)
This patient has had multiple cancellations this year (7 or 8). He should be charged a cancellation fee and he should not be scheduled for any further procedures without a clinic visit first. You can book him for a clinic visit if he wishes to see me. If he no shows again he will be discharged from the practice. Thanks

## 2017-12-30 ENCOUNTER — Emergency Department (HOSPITAL_COMMUNITY): Payer: Self-pay

## 2017-12-30 ENCOUNTER — Other Ambulatory Visit: Payer: Self-pay

## 2017-12-30 ENCOUNTER — Encounter (HOSPITAL_COMMUNITY): Payer: Self-pay

## 2017-12-30 ENCOUNTER — Emergency Department (HOSPITAL_COMMUNITY)
Admission: EM | Admit: 2017-12-30 | Discharge: 2017-12-30 | Disposition: A | Discharge status: Home / Self Care | Payer: Self-pay | Attending: Emergency Medicine | Admitting: Emergency Medicine

## 2017-12-30 DIAGNOSIS — J09X2 Influenza due to identified novel influenza A virus with other respiratory manifestations: Secondary | ICD-10-CM | POA: Insufficient documentation

## 2017-12-30 DIAGNOSIS — Z79899 Other long term (current) drug therapy: Secondary | ICD-10-CM | POA: Insufficient documentation

## 2017-12-30 DIAGNOSIS — J101 Influenza due to other identified influenza virus with other respiratory manifestations: Secondary | ICD-10-CM

## 2017-12-30 DIAGNOSIS — R6889 Other general symptoms and signs: Secondary | ICD-10-CM

## 2017-12-30 LAB — INFLUENZA PANEL BY PCR (TYPE A & B)
Influenza A By PCR: POSITIVE — AB
Influenza B By PCR: NEGATIVE

## 2017-12-30 MED ORDER — ACETAMINOPHEN 325 MG PO TABS
650.0000 mg | ORAL_TABLET | Freq: Once | ORAL | Status: AC | PRN
  Administered 2017-12-30: 650 mg via ORAL
  Filled 2017-12-30: qty 2

## 2017-12-30 MED ORDER — OSELTAMIVIR PHOSPHATE 75 MG PO CAPS: 75.0000 mg | ORAL_CAPSULE | Freq: Two times a day (BID) | ORAL | 0 refills | Status: DC

## 2017-12-30 NOTE — ED Provider Notes (Signed)
[name] Provider Note   CSN: [id number] Arrival date & time: 12/30/17  1609     History   Chief Complaint Chief Complaint  Patient presents with  . Cough  . Nasal Congestion  . Generalized Body Aches    HPI Ricardo [name] is a 57 y.o. male with past medical history significant for sinus congestion and allergies presenting with sudden onset congestion, generalized myalgia, fever which started yesterday.  Patient is not up-to-date on immunization for flu. Denies any known ill contacts.  No recent travel.  Patient has not tried anything for his symptoms.  HPI  Past Medical History:  Diagnosis Date  . Allergy   . Shingles   . Sinus congestion     Patient Active Problem List   Diagnosis Date Noted  . Seasonal allergies 10/31/2011    Past Surgical History:  Procedure Laterality Date  . NO PAST SURGERIES         Home Medications    Prior to Admission medications   Medication Sig Start Date End Date Taking? Authorizing Provider  albuterol (PROVENTIL HFA;VENTOLIN HFA) 108 (90 Base) MCG/ACT inhaler Inhale 1-2 puffs into the lungs every 6 (six) hours as needed for wheezing or shortness of breath (or cough/congestion/etc). 05/27/17   [name], Mercedes, PA-C  COD LIVER OIL PO Take by mouth daily.    [provider]  fluticasone (FLONASE) 50 MCG/ACT nasal spray Place 2 sprays into both nostrils daily. 09/25/17   Maczis, Ricardo Sow, PA-C  Multiple Vitamin (MULTIVITAMIN) tablet Take 1 tablet by mouth daily.    [provider]  Omega-3 Fatty Acids (FISH OIL) 1000 MG CAPS Take by mouth.    [provider]  OVER THE COUNTER MEDICATION OTC cold and flumedicine PRN    [provider]  OVER THE COUNTER MEDICATION Equate allergy pill- takes PRN    [provider]    Family History Family History  Problem Relation Age of Onset  . Hyperlipidemia Father   . Colon cancer Neg Hx   . Colon polyps Neg Hx    . Esophageal cancer Neg Hx   . Rectal cancer Neg Hx   . Stomach cancer Neg Hx     Social History Social History   Tobacco Use  . Smoking status: Never Smoker  . Smokeless tobacco: Never Used  Substance Use Topics  . Alcohol use: No  . Drug use: No     Allergies   Patient has no known allergies.   Review of Systems Review of Systems  Constitutional: Positive for fatigue and fever. Negative for chills and diaphoresis.  HENT: Positive for congestion, ear pain, sinus pressure and sinus pain. Negative for drooling, ear discharge, sore throat, tinnitus, trouble swallowing and voice change.        Frontal sinus headache pressure  Eyes: Negative for photophobia, pain, redness and visual disturbance.  Respiratory: Positive for cough. Negative for choking, chest tightness, shortness of breath, wheezing and stridor.   Cardiovascular: Negative for chest pain, palpitations and leg swelling.  Gastrointestinal: Negative for abdominal distention, abdominal pain, diarrhea, nausea and vomiting.  Genitourinary: Negative for difficulty urinating, dysuria and hematuria.  Musculoskeletal: Positive for myalgias. Negative for arthralgias, gait problem, joint swelling, neck pain and neck stiffness.  Skin: Negative for color change, pallor and rash.  Neurological: Positive for headaches. Negative for dizziness, seizures, syncope, facial asymmetry, weakness, light-headedness and numbness.     Physical Exam Updated Vital Signs BP 120/77 (BP Location: Right Arm)  Pulse 96   Temp 99.6 F (37.6 C) (Oral)   Resp 20   Ht 5\' 7"  (1.702 m)   Wt 90.7 kg (200 lb)   SpO2 100%   BMI 31.32 kg/m   Physical Exam  Constitutional: He is oriented to person, place, and time. He appears well-developed and well-nourished. No distress.  Febrile on arrival at 102.7 and slightly tachycardic but nontoxic appearing sitting comfortably in chair in no acute distress.  HENT:  Head: Normocephalic and atraumatic.    Right Ear: External ear normal.  Left Ear: External ear normal.  Mouth/Throat: Oropharynx is clear and moist. No oropharyngeal exudate.  Normal TMs bilaterally. Boggy turbinates worse on the left  Eyes: Conjunctivae and EOM are normal. Right eye exhibits no discharge. Left eye exhibits no discharge. No scleral icterus.  Neck: Normal range of motion. Neck supple.  No meningeal signs  Cardiovascular: Normal rate, regular rhythm, normal heart sounds and intact distal pulses.  No murmur heard. Pulmonary/Chest: Effort normal and breath sounds normal. No stridor. No respiratory distress. He has no wheezes. He has no rales.  Abdominal: He exhibits no distension.  Musculoskeletal: Normal range of motion. He exhibits no edema or deformity.  Neurological: He is alert and oriented to person, place, and time.  Skin: Skin is warm and dry. No rash noted. He is not diaphoretic. No erythema. No pallor.  Psychiatric: He has a normal mood and affect.  Nursing note and vitals reviewed.    ED Treatments / Results  Labs (all labs ordered are listed, but only abnormal results are displayed) Labs Reviewed  INFLUENZA PANEL BY PCR (TYPE A & B) - Abnormal; Notable for the following components:      Result Value   Influenza A By PCR POSITIVE (*)    All other components within normal limits    EKG  EKG Interpretation None       Radiology Dg Chest 2 View  Result Date: 12/30/2017 CLINICAL DATA:  Nasal congestion, drainage, flu-like symptoms. Productive cough. EXAM: CHEST  2 VIEW COMPARISON:  09/25/2017 FINDINGS: Linear atelectasis or scarring in the lingula at the left base. Right lung clear. Heart is normal size. No effusions or acute bony abnormality. IMPRESSION: Minimal lingular atelectasis or scarring.  No active disease. Electronically Signed   By: Charlett NoseKevin  Dover M.D.   On: 12/30/2017 18:14    Procedures Procedures (including critical care time)  Medications Ordered in ED Medications   acetaminophen (TYLENOL) tablet 650 mg (650 mg Oral Given 12/30/17 1721)     Initial Impression / Assessment and Plan / ED Course  I have reviewed the triage vital signs and the nursing notes.  Pertinent labs & imaging results that were available during my care of the patient were reviewed by me and considered in my medical decision making (see chart for details).     Patient presenting with sudden onset flu-like symptoms in the last 24 hours with positive swab for influenza A. He has not tried anything for the symptoms.  Patient improved in the emergency department with resolution of fever and tachycardia.  Hemodynamically stable.  Eating and drinking well in the ED. Patient is otherwise well-appearing nontoxic and in no distress.  Patient was within the window for Tamiflu. Discussed Tamiflu with patient and he declined due to cost. Provided resources to patient for low cost prescription medications.  Patient refused any prescription medications today.  Advised patient to alternate between ibuprofen and Tylenol for fever and pain and use nasal spray  to help with congestion as well as cough medications as needed.  Advised hydration and rest and good hand hygiene.  Will discharge home with close follow-up with the [facility] for primary care.  Discussed strict return precautions and advised to return to the emergency department if experiencing any new or worsening symptoms. Instructions were understood and patient agreed with discharge plan. Final Clinical Impressions(s) / ED Diagnoses   Final diagnoses:  Flu-like symptoms  Influenza A    ED Discharge Orders        Ordered    oseltamivir (TAMIFLU) 75 MG capsule  Every 12 hours,   Status:  Discontinued     12/30/17 2034       Georgiana Shore, PA-C 12/30/17 2220    Linwood Dibbles, MD 12/31/17 [phone]

## 2017-12-30 NOTE — Discharge Instructions (Addendum)
As discussed, make sure that you drink plenty of fluids and stay well-hydrated and get some rest. Take naproxen and Tylenol as needed for fever and pain.  Follow-up with the wellness center for primary care.  Return sooner if symptoms worsen, difficulty breathing, chest pain, or any other new concerning symptoms in the meantime.

## 2017-12-30 NOTE — ED Triage Notes (Signed)
Patient presents with nasal congestion, nasal drainage, productive cough, fever, headache, generalized body aches and lethargy starting yesterday, 1/29. Patient denies being around anyone sick. Patient reports taking cough medicine at 1000 this morning, but denies taking any other medications for his symptoms.

## 2017-12-30 NOTE — ED Notes (Signed)
Patient given water, ginger ale, graham crackers and peanut butter

## 2018-01-28 ENCOUNTER — Ambulatory Visit: Payer: Self-pay | Admitting: Gastroenterology

## 2018-01-28 ENCOUNTER — Telehealth: Payer: Self-pay | Admitting: Gastroenterology

## 2018-01-28 NOTE — Telephone Encounter (Signed)
He's had 8 cancellations / no shows in the past year. The last time he did this, he was told he needed to keep his next appointment or would be discharged from the practice. If he had a job interview he should have contacted [place] in advance and cancelled his appointment. I'm never met him, given his no shows / cancellations despite warnings about this, I will not see him in the future and he can seek care elsewhere.

## 2018-01-28 NOTE — Telephone Encounter (Signed)
Pt called and stated he was unable to make his appointment today bc he was in a "job interview" however, I heard traffic in the background.  Per Dr. Lanetta InchArmbruster's note on 11-20-17 pt has had numerous cancellation/No show appointments and would be discharged from practice if he canceled appointment again.  Please advise?  Thanks!

## 2018-01-29 NOTE — Telephone Encounter (Signed)
Per Dr. Adela LankArmbruster future appts or procedures may not be scheduled.  Pt has cancelled 8 times.  Per Dellia BeckwithJoan Curtain 'practice dismissal letter' is not necessary because pt has never established a relationship with a provider at the practice.  Dismissal letter needed only when relationship has been established by a kept appt or completed procedure. Chart has been marked to reflect no future scheduling of appts or procedures.

## 2018-02-05 ENCOUNTER — Emergency Department (HOSPITAL_COMMUNITY): Payer: Self-pay

## 2018-02-05 ENCOUNTER — Emergency Department (HOSPITAL_COMMUNITY)
Admission: EM | Admit: 2018-02-05 | Discharge: 2018-02-05 | Disposition: A | Discharge status: Home / Self Care | Payer: Self-pay | Attending: Emergency Medicine | Admitting: Emergency Medicine

## 2018-02-05 ENCOUNTER — Encounter (HOSPITAL_COMMUNITY): Payer: Self-pay | Admitting: Emergency Medicine

## 2018-02-05 DIAGNOSIS — B9789 Other viral agents as the cause of diseases classified elsewhere: Secondary | ICD-10-CM | POA: Insufficient documentation

## 2018-02-05 DIAGNOSIS — J0100 Acute maxillary sinusitis, unspecified: Secondary | ICD-10-CM | POA: Insufficient documentation

## 2018-02-05 DIAGNOSIS — J069 Acute upper respiratory infection, unspecified: Secondary | ICD-10-CM | POA: Insufficient documentation

## 2018-02-05 DIAGNOSIS — R51 Headache: Secondary | ICD-10-CM | POA: Insufficient documentation

## 2018-02-05 DIAGNOSIS — R0981 Nasal congestion: Secondary | ICD-10-CM | POA: Insufficient documentation

## 2018-02-05 DIAGNOSIS — Z79899 Other long term (current) drug therapy: Secondary | ICD-10-CM | POA: Insufficient documentation

## 2018-02-05 MED ORDER — FLUTICASONE PROPIONATE 50 MCG/ACT NA SUSP: 1.0000 | Freq: Every day | NASAL | 2 refills | Status: DC

## 2018-02-05 MED ORDER — AMOXICILLIN-POT CLAVULANATE 875-125 MG PO TABS: 1.0000 | ORAL_TABLET | Freq: Two times a day (BID) | ORAL | 0 refills | Status: DC

## 2018-02-05 MED ORDER — DEXAMETHASONE SODIUM PHOSPHATE 10 MG/ML IJ SOLN
10.0000 mg | Freq: Once | INTRAMUSCULAR | Status: AC
  Administered 2018-02-05: 10 mg via INTRAMUSCULAR
  Filled 2018-02-05: qty 1

## 2018-02-05 MED ORDER — BENZONATATE 100 MG PO CAPS: 200.0000 mg | ORAL_CAPSULE | Freq: Three times a day (TID) | ORAL | 0 refills | Status: DC

## 2018-02-05 NOTE — ED Triage Notes (Signed)
Pt c/o cough for 4 weeks with congestion and intermittent headache. Denies N/V/D.

## 2018-02-05 NOTE — ED Provider Notes (Signed)
[facility]-EMERGENCY DEPT Provider Note   CSN: [id number] Arrival date & time: 02/05/18  1848     History   Chief Complaint Chief Complaint  Patient presents with  . Cough    HPI Ricardo [name] is a 57 y.o. male with a past medical history of seasonal allergies, who presents to ED for evaluation of 4-week history of nasal congestion, sinus pressure, initially productive and now dry cough, itchy and watery eyes.  States this is allergy symptoms usually flare up during this time of year.  He has tried over-the-counter generic severe sinus and allergy medication with mild improvement in his symptoms.  He also has noted some wheezing.  He denies any trouble breathing or trouble swallowing, sick contacts with similar symptoms, fever, vomiting.  He did not receive his influenza vaccine this year. Of note, patient's triage notes reports headaches for the past 4 weeks.  He describes this more as sinus and facial pain.  HPI  Past Medical History:  Diagnosis Date  . Allergy   . Shingles   . Sinus congestion     Patient Active Problem List   Diagnosis Date Noted  . Seasonal allergies 10/31/2011    Past Surgical History:  Procedure Laterality Date  . NO PAST SURGERIES         Home Medications    Prior to Admission medications   Medication Sig Start Date End Date Taking? Authorizing Provider  albuterol (PROVENTIL HFA;VENTOLIN HFA) 108 (90 Base) MCG/ACT inhaler Inhale 1-2 puffs into the lungs every 6 (six) hours as needed for wheezing or shortness of breath (or cough/congestion/etc). 05/27/17   Street, Mercedes, PA-C  amoxicillin-clavulanate (AUGMENTIN) 875-125 MG tablet Take 1 tablet by mouth every 12 (twelve) hours. 02/05/18   Jory Tanguma, PA-C  benzonatate (TESSALON) 100 MG capsule Take 2 capsules (200 mg total) by mouth every 8 (eight) hours. 02/05/18   Melburn Treiber, PA-C  COD LIVER OIL PO Take by mouth daily.    [provider]  fluticasone (FLONASE)  50 MCG/ACT nasal spray Place 1 spray into both nostrils daily. 02/05/18   Aryannah Mohon, PA-C  Multiple Vitamin (MULTIVITAMIN) tablet Take 1 tablet by mouth daily.    [provider]  Omega-3 Fatty Acids (FISH OIL) 1000 MG CAPS Take by mouth.    [provider]  OVER THE COUNTER MEDICATION OTC cold and flumedicine PRN    [provider]  OVER THE COUNTER MEDICATION Equate allergy pill- takes PRN    [provider]    Family History Family History  Problem Relation Age of Onset  . Hyperlipidemia Father   . Colon cancer Neg Hx   . Colon polyps Neg Hx   . Esophageal cancer Neg Hx   . Rectal cancer Neg Hx   . Stomach cancer Neg Hx     Social History Social History   Tobacco Use  . Smoking status: Never Smoker  . Smokeless tobacco: Never Used  Substance Use Topics  . Alcohol use: No  . Drug use: No     Allergies   Patient has no known allergies.   Review of Systems Review of Systems  Constitutional: Negative for chills and fever.  HENT: Positive for congestion, sinus pressure and sinus pain. Negative for facial swelling, hearing loss, mouth sores, sneezing, sore throat and trouble swallowing.   Eyes: Positive for discharge, redness and itching.  Respiratory: Positive for cough. Negative for shortness of breath.   Gastrointestinal: Negative for nausea and vomiting.  Physical Exam Updated Vital Signs BP 128/87 (BP Location: Left Arm)   Pulse 85   Temp 98 F (36.7 C) (Oral)   Resp 19   SpO2 99%   Physical Exam  Constitutional: He appears well-developed and well-nourished. No distress.  HENT:  Head: Normocephalic and atraumatic.  Right Ear: A middle ear effusion is present.  Left Ear: A middle ear effusion is present.  Nose: Mucosal edema present. Right sinus exhibits maxillary sinus tenderness and frontal sinus tenderness. Left sinus exhibits maxillary sinus tenderness and frontal sinus tenderness.    Mouth/Throat: Uvula is  midline. No posterior oropharyngeal edema or posterior oropharyngeal erythema. No tonsillar exudate.  Eyes: Conjunctivae and EOM are normal. Right eye exhibits discharge. Left eye exhibits discharge. No scleral icterus.  Clear, tearful drainage noted in bilateral eyes.  Neck: Normal range of motion. Neck supple.  Cardiovascular: Normal rate, regular rhythm, normal heart sounds and intact distal pulses. Exam reveals no gallop and no friction rub.  No murmur heard. Pulmonary/Chest: Effort normal. No respiratory distress. He has wheezes.  Mild end expiratory wheezing of left middle and lower lung fields.  Abdominal: Soft. Bowel sounds are normal. He exhibits no distension. There is no tenderness. There is no guarding.  Musculoskeletal: Normal range of motion. He exhibits no edema.  Neurological: He is alert. He exhibits normal muscle tone. Coordination normal.  Skin: Skin is warm and dry. No rash noted.  Psychiatric: He has a normal mood and affect.  Nursing note and vitals reviewed.    ED Treatments / Results  Labs (all labs ordered are listed, but only abnormal results are displayed) Labs Reviewed - No data to display  EKG  EKG Interpretation None       Radiology Dg Chest 2 View  Result Date: 02/05/2018 CLINICAL DATA:  Cough for 4 weeks with congestion EXAM: CHEST - 2 VIEW COMPARISON:  12/30/2017 FINDINGS: Mild bronchitic changes. No focal pulmonary opacity or pleural effusion is seen. Stable cardiomediastinal silhouette. No pneumothorax. IMPRESSION: No active cardiopulmonary disease. Electronically Signed   By: Jasmine Pang M.D.   On: 02/05/2018 20:18    Procedures Procedures (including critical care time)  Medications Ordered in ED Medications  dexamethasone (DECADRON) injection 10 mg (not administered)     Initial Impression / Assessment and Plan / ED Course  I have reviewed the triage vital signs and the nursing notes.  Pertinent labs & imaging results that were  available during my care of the patient were reviewed by me and considered in my medical decision making (see chart for details).     Patient presents to ED for evaluation of 4-week history of nasal congestion, sinus pressure, cough, itchy and watery eyes.  States that allergy symptoms usually flare up during this time of year.  He denies any fever, trouble breathing or trouble swallowing, vomiting, sick contacts with similar symptoms.  On physical exam patient is overall well-appearing.  There is some mild end expiratory wheezing on the left middle and lower lung field with no signs of respiratory distress or tachypnea.  He is satting at 99% on room air without difficulty.  He does have some maxillary and frontal sinus tenderness to palpation.  Chest x-ray returned as negative.  Will treat with steroids, Augmentin for sinus infection considering his symptoms have been going on for well over 10 days.  Will also give Tessalon Perles as needed for cough, so advised patient to take it once daily antihistamine as needed for his allergy symptoms especially  during the season when he knows he gets flareups.  Patient appears stable for discharge at this time.  Strict return precautions given.  Portions of this note were generated with Scientist, clinical (histocompatibility and immunogenetics). Dictation errors may occur despite best attempts at proofreading.   Final Clinical Impressions(s) / ED Diagnoses   Final diagnoses:  Viral URI with cough  Acute non-recurrent maxillary sinusitis    ED Discharge Orders        Ordered    amoxicillin-clavulanate (AUGMENTIN) 875-125 MG tablet  Every 12 hours     02/05/18 2050    fluticasone (FLONASE) 50 MCG/ACT nasal spray  Daily     02/05/18 2050    benzonatate (TESSALON) 100 MG capsule  Every 8 hours     02/05/18 2050       Dietrich Pates, PA-C 02/05/18 2058    Derwood Kaplan, MD 02/06/18 0111

## 2018-02-05 NOTE — Discharge Instructions (Signed)
Please read attached information regarding your condition. Take Augmentin twice daily for 7 days.  Please complete the entire course of this medication regardless of symptom improvement. Use Flonase nasal spray to help with congestion.  Take Tessalon Perles as needed for cough. You can take a daily antihistamine such as Allegra, Claritin, Zyrtec generic to help with your allergy symptoms during this season. Follow-up your PCP for further evaluation. Return to ED for worsening symptoms, trouble breathing or trouble swallowing, coughing up blood, chest pain or shortness of breath.

## 2018-07-09 ENCOUNTER — Encounter (HOSPITAL_COMMUNITY): Payer: Self-pay | Admitting: Emergency Medicine

## 2018-07-09 ENCOUNTER — Emergency Department (HOSPITAL_COMMUNITY)
Admission: EM | Admit: 2018-07-09 | Discharge: 2018-07-10 | Disposition: A | Discharge status: Home / Self Care | Payer: Self-pay | Attending: Emergency Medicine | Admitting: Emergency Medicine

## 2018-07-09 ENCOUNTER — Other Ambulatory Visit: Payer: Self-pay

## 2018-07-09 DIAGNOSIS — J0101 Acute recurrent maxillary sinusitis: Secondary | ICD-10-CM

## 2018-07-09 DIAGNOSIS — J019 Acute sinusitis, unspecified: Secondary | ICD-10-CM | POA: Insufficient documentation

## 2018-07-09 DIAGNOSIS — Z79899 Other long term (current) drug therapy: Secondary | ICD-10-CM | POA: Insufficient documentation

## 2018-07-09 MED ORDER — ALBUTEROL SULFATE HFA 108 (90 BASE) MCG/ACT IN AERS
2.0000 | INHALATION_SPRAY | Freq: Once | RESPIRATORY_TRACT | Status: AC
  Administered 2018-07-09: 2 via RESPIRATORY_TRACT
  Filled 2018-07-09: qty 6.7

## 2018-07-09 MED ORDER — AMOXICILLIN-POT CLAVULANATE 875-125 MG PO TABS: 1.0000 | ORAL_TABLET | Freq: Two times a day (BID) | ORAL | 0 refills | Status: DC

## 2018-07-09 NOTE — ED Triage Notes (Signed)
Pt from home with c/o sinus pressure and cough x 1 week. Pt states he is coughing up clear sputum. Pt rates pain 9-10/10.  Pt is not tachycardic nor febrile.

## 2018-07-09 NOTE — ED Notes (Signed)
ED Provider at bedside. 

## 2018-07-09 NOTE — ED Provider Notes (Signed)
[facility]-EMERGENCY DEPT Provider Note   CSN: [id number] Arrival date & time: 07/09/18  2114     History   Chief Complaint Chief Complaint  Patient presents with  . Facial Pain    HPI Ricardo [name] is a 57 y.o. male.  Patient presents for evaluation of facial pain across maxillary sinus and frontal sinus areas bilaterally. Symptoms started 4 weeks ago and have been worse over the last 4 days. No fever at any time. He has tried allergy medications without relief. He denies modifying factors. He has had sinusitis in the past that was similar. No sore throat, nausea or vomiting.  The history is provided by the patient. No language interpreter was used.    Past Medical History:  Diagnosis Date  . Allergy   . Shingles   . Sinus congestion     Patient Active Problem List   Diagnosis Date Noted  . Seasonal allergies 10/31/2011    Past Surgical History:  Procedure Laterality Date  . NO PAST SURGERIES          Home Medications    Prior to Admission medications   Medication Sig Start Date End Date Taking? Authorizing Provider  albuterol (PROVENTIL HFA;VENTOLIN HFA) 108 (90 Base) MCG/ACT inhaler Inhale 1-2 puffs into the lungs every 6 (six) hours as needed for wheezing or shortness of breath (or cough/congestion/etc). 05/27/17   Street, Mercedes, PA-C  amoxicillin-clavulanate (AUGMENTIN) 875-125 MG tablet Take 1 tablet by mouth every 12 (twelve) hours. 02/05/18   Khatri, Hina, PA-C  benzonatate (TESSALON) 100 MG capsule Take 2 capsules (200 mg total) by mouth every 8 (eight) hours. 02/05/18   Khatri, Hina, PA-C  COD LIVER OIL PO Take by mouth daily.    [provider]  fluticasone (FLONASE) 50 MCG/ACT nasal spray Place 1 spray into both nostrils daily. 02/05/18   Khatri, Hina, PA-C  Multiple Vitamin (MULTIVITAMIN) tablet Take 1 tablet by mouth daily.    [provider]  Omega-3 Fatty Acids (FISH OIL) 1000 MG CAPS Take by mouth.    [provider]  OVER THE COUNTER MEDICATION OTC cold and flumedicine PRN    [provider]  OVER THE COUNTER MEDICATION Equate allergy pill- takes PRN    [provider]    Family History Family History  Problem Relation Age of Onset  . Hyperlipidemia Father   . Colon cancer Neg Hx   . Colon polyps Neg Hx   . Esophageal cancer Neg Hx   . Rectal cancer Neg Hx   . Stomach cancer Neg Hx     Social History Social History   Tobacco Use  . Smoking status: Never Smoker  . Smokeless tobacco: Never Used  Substance Use Topics  . Alcohol use: No  . Drug use: No     Allergies   Patient has no known allergies.   Review of Systems Review of Systems  Constitutional: Negative for chills, diaphoresis and fever.  HENT: Positive for postnasal drip, sinus pressure and sinus pain. Negative for congestion, sore throat and trouble swallowing.   Respiratory: Negative.  Negative for shortness of breath.   Cardiovascular: Negative.  Negative for chest pain.  Gastrointestinal: Negative.  Negative for abdominal pain, nausea and vomiting.  Musculoskeletal: Negative.  Negative for myalgias and neck stiffness.  Skin: Negative.  Negative for rash.  Neurological: Positive for headaches (See HPI.).     Physical Exam Updated Vital Signs BP (!) 142/92 (BP Location: Left Arm)  Pulse 89   Temp 98.5 F (36.9 C) (Oral)   Resp 18   SpO2 100%   Physical Exam  Constitutional: He is oriented to person, place, and time. He appears well-developed and well-nourished.  HENT:  Head: Normocephalic.  Right Ear: A middle ear effusion is present.  Left Ear: A middle ear effusion is present.  Nose: Mucosal edema present. Right sinus exhibits maxillary sinus tenderness and frontal sinus tenderness. Left sinus exhibits maxillary sinus tenderness and frontal sinus tenderness.  Mouth/Throat: Uvula is midline, oropharynx is clear and moist and mucous membranes are normal.  Neck: Normal range  of motion. Neck supple.  Cardiovascular: Normal rate and regular rhythm.  No murmur heard. Pulmonary/Chest: Effort normal and breath sounds normal. He has no wheezes. He has no rales.  Abdominal: Soft. Bowel sounds are normal. There is no tenderness. There is no rebound and no guarding.  Musculoskeletal: Normal range of motion.  Neurological: He is alert and oriented to person, place, and time.  Skin: Skin is warm and dry. No rash noted.  Psychiatric: He has a normal mood and affect.  Nursing note and vitals reviewed.    ED Treatments / Results  Labs (all labs ordered are listed, but only abnormal results are displayed) Labs Reviewed - No data to display  EKG None  Radiology No results found.  Procedures Procedures (including critical care time)  Medications Ordered in ED Medications - No data to display   Initial Impression / Assessment and Plan / ED Course  I have reviewed the triage vital signs and the nursing notes.  Pertinent labs & imaging results that were available during my care of the patient were reviewed by me and considered in my medical decision making (see chart for details).     Patient presents with recurrent sinus symptoms of pain, pressure and post-nasal drip. sxs x 4 weeks, getting worse. No fever.   Given duration of symptoms will treat with antibiotic. Recommended PCP follow up for recheck to insure resolution of symptoms.   Final Clinical Impressions(s) / ED Diagnoses   Final diagnoses:  None   1. Sinusitis  ED Discharge Orders    None       Elpidio Anis, PA-C 07/10/18 3244    Vanetta Mulders, MD 07/12/18 [phone]

## 2018-07-10 NOTE — ED Notes (Signed)
Discharge instructions reviewed with pt. Pt verbalized understanding. Pt to follow up with PCP. Pt ambulatory to waiting room.  

## 2019-01-30 ENCOUNTER — Encounter (HOSPITAL_COMMUNITY): Payer: Self-pay | Admitting: Emergency Medicine

## 2019-01-30 ENCOUNTER — Emergency Department (HOSPITAL_COMMUNITY)
Admission: EM | Admit: 2019-01-30 | Discharge: 2019-01-30 | Disposition: A | Discharge status: Home / Self Care | Payer: Self-pay | Attending: Emergency Medicine | Admitting: Emergency Medicine

## 2019-01-30 ENCOUNTER — Other Ambulatory Visit: Payer: Self-pay

## 2019-01-30 DIAGNOSIS — J029 Acute pharyngitis, unspecified: Secondary | ICD-10-CM | POA: Insufficient documentation

## 2019-01-30 DIAGNOSIS — R059 Cough, unspecified: Secondary | ICD-10-CM

## 2019-01-30 DIAGNOSIS — J0140 Acute pansinusitis, unspecified: Secondary | ICD-10-CM | POA: Insufficient documentation

## 2019-01-30 DIAGNOSIS — R0981 Nasal congestion: Secondary | ICD-10-CM

## 2019-01-30 DIAGNOSIS — R05 Cough: Secondary | ICD-10-CM

## 2019-01-30 DIAGNOSIS — I10 Essential (primary) hypertension: Secondary | ICD-10-CM | POA: Insufficient documentation

## 2019-01-30 DIAGNOSIS — Z79899 Other long term (current) drug therapy: Secondary | ICD-10-CM | POA: Insufficient documentation

## 2019-01-30 MED ORDER — FLUTICASONE PROPIONATE 50 MCG/ACT NA SUSP
2.0000 | Freq: Every day | NASAL | Status: DC
  Administered 2019-01-30: 2 via NASAL
  Filled 2019-01-30: qty 16

## 2019-01-30 MED ORDER — CETIRIZINE HCL 10 MG PO TABS: 10.0000 mg | ORAL_TABLET | Freq: Every day | ORAL | 0 refills | Status: DC

## 2019-01-30 MED ORDER — AMOXICILLIN 500 MG PO CAPS
500.0000 mg | ORAL_CAPSULE | Freq: Once | ORAL | Status: AC
  Administered 2019-01-30: 500 mg via ORAL
  Filled 2019-01-30: qty 1

## 2019-01-30 MED ORDER — ACETAMINOPHEN 500 MG PO TABS
1000.0000 mg | ORAL_TABLET | Freq: Once | ORAL | Status: AC
  Administered 2019-01-30: 1000 mg via ORAL
  Filled 2019-01-30: qty 2

## 2019-01-30 MED ORDER — AMOXICILLIN 500 MG PO CAPS: 500.0000 mg | ORAL_CAPSULE | Freq: Three times a day (TID) | ORAL | 0 refills | Status: AC

## 2019-01-30 NOTE — ED Provider Notes (Signed)
[facility]-EMERGENCY DEPT Provider Note   CSN: [id number] Arrival date & time: 01/30/19  1647    History   Chief Complaint Chief Complaint  Patient presents with  . Generalized Body Aches  . Nasal Congestion    HPI Ricardo [name] is a 58 y.o. male with history of sinus congestion, seasonal allergies presents for evaluation of acute onset, persistent nasal congestion, sinus pressure, cough for 1 month.  He reports that it has been "more constant than not ".  Notes cough productive of clear sputum.  Reports that he feels short of breath sometimes at night when he lays flat to go to sleep because he feels as though he cannot get a deep breath due to his nasal congestion and sinus pressure.  He reports that his symptoms are consistent with sinus allergy flares and sinusitis he is experienced in the past.  He reports that antibiotics are sometimes helpful.  He has been taking over-the-counter allergy medicines but none recently.  Has also been taking naproxen with some relief.  He is a non-smoker.     The history is provided by the patient.    Past Medical History:  Diagnosis Date  . Allergy   . Shingles   . Sinus congestion     Patient Active Problem List   Diagnosis Date Noted  . Seasonal allergies 10/31/2011    Past Surgical History:  Procedure Laterality Date  . NO PAST SURGERIES          Home Medications    Prior to Admission medications   Medication Sig Start Date End Date Taking? Authorizing Provider  albuterol (PROVENTIL HFA;VENTOLIN HFA) 108 (90 Base) MCG/ACT inhaler Inhale 1-2 puffs into the lungs every 6 (six) hours as needed for wheezing or shortness of breath (or cough/congestion/etc). Patient not taking: Reported on 07/09/2018 05/27/17   Street, [place], PA-C  amoxicillin (AMOXIL) 500 MG capsule Take 1 capsule (500 mg total) by mouth 3 (three) times daily for 7 days. 01/30/19 02/06/19  Michela Pitcher A, PA-C  amoxicillin-clavulanate  (AUGMENTIN) 875-125 MG tablet Take 1 tablet by mouth every 12 (twelve) hours. 07/09/18   Elpidio Anis, PA-C  benzonatate (TESSALON) 100 MG capsule Take 2 capsules (200 mg total) by mouth every 8 (eight) hours. Patient not taking: Reported on 07/09/2018 02/05/18   Dietrich Pates, PA-C  cetirizine (ZYRTEC ALLERGY) 10 MG tablet Take 1 tablet (10 mg total) by mouth daily. 01/30/19   Aarin Bluett A, PA-C  COD LIVER OIL PO Take by mouth daily.    [provider]  diphenhydrAMINE-Acetaminophen (SEVERE ALLERGY PO) Take 1 tablet by mouth daily as needed (allergies).    [provider]  fluticasone (FLONASE) 50 MCG/ACT nasal spray Place 1 spray into both nostrils daily. 02/05/18   Khatri, Hina, PA-C  Multiple Vitamin (MULTIVITAMIN) tablet Take 1 tablet by mouth daily.    [provider]  Omega-3 Fatty Acids (FISH OIL) 1000 MG CAPS Take by mouth.    [provider]    Family History Family History  Problem Relation Age of Onset  . Hyperlipidemia Father   . Colon cancer Neg Hx   . Colon polyps Neg Hx   . Esophageal cancer Neg Hx   . Rectal cancer Neg Hx   . Stomach cancer Neg Hx     Social History Social History   Tobacco Use  . Smoking status: Never Smoker  . Smokeless tobacco: Never Used  Substance Use Topics  . Alcohol use: No  .  Drug use: No     Allergies   Patient has no known allergies.   Review of Systems Review of Systems  Constitutional: Positive for chills. Negative for fever.  HENT: Positive for congestion, sinus pressure, sinus pain and sore throat. Negative for trouble swallowing.   Respiratory: Positive for cough and shortness of breath.   Cardiovascular: Negative for chest pain.     Physical Exam Updated Vital Signs BP (!) 145/105   Pulse 72   Temp 98.3 F (36.8 C) (Oral)   Resp 19   SpO2 100%   Physical Exam Vitals signs and nursing note reviewed.  Constitutional:      General: He is not in acute distress.    Appearance: He is  well-developed.  HENT:     Head: Normocephalic and atraumatic.     Right Ear: Ear canal and external ear normal.     Left Ear: Ear canal and external ear normal.     Ears:     Comments: Middle ear effusion noted bilaterally    Nose: Congestion present.     Comments: Bilateral maxillary and frontal sinus tenderness.  Mucosal edema noted bilaterally.    Mouth/Throat:     Mouth: Mucous membranes are moist.     Pharynx: Oropharynx is clear. No oropharyngeal exudate or posterior oropharyngeal erythema.     Comments: Posterior oropharynx with no tonsillar hypertrophy, exudates, uvular deviation, or sublingual abnormalities.  Tolerating secretions without difficulty. Eyes:     General:        Right eye: No discharge.        Left eye: No discharge.     Conjunctiva/sclera: Conjunctivae normal.  Neck:     Vascular: No JVD.     Trachea: No tracheal deviation.     Comments: Bilateral anterior cervical lymphadenopathy Cardiovascular:     Rate and Rhythm: Normal rate and regular rhythm.     Heart sounds: Normal heart sounds.  Pulmonary:     Effort: Pulmonary effort is normal. No respiratory distress.     Breath sounds: Normal breath sounds. No stridor. No wheezing, rhonchi or rales.  Abdominal:     General: There is no distension.  Lymphadenopathy:     Cervical: Cervical adenopathy present.  Skin:    General: Skin is warm and dry.     Findings: No erythema.  Neurological:     Mental Status: He is alert.  Psychiatric:        Behavior: Behavior normal.      ED Treatments / Results  Labs (all labs ordered are listed, but only abnormal results are displayed) Labs Reviewed - No data to display  EKG None  Radiology No results found.  Procedures Procedures (including critical care time)  Medications Ordered in ED Medications  fluticasone (FLONASE) 50 MCG/ACT nasal spray 2 spray (2 sprays Each Nare Given 01/30/19 1822)  amoxicillin (AMOXIL) capsule 500 mg (500 mg Oral Given  01/30/19 1738)  acetaminophen (TYLENOL) tablet 1,000 mg (1,000 mg Oral Given 01/30/19 1738)     Initial Impression / Assessment and Plan / ED Course  I have reviewed the triage vital signs and the nursing notes.  Pertinent labs & imaging results that were available during my care of the patient were reviewed by me and considered in my medical decision making (see chart for details).        Patient presents for evaluation of sinus congestion, cough, sore throat for 1 month.  He is afebrile, somewhat hypertensive in the ED.  He reports he has been taking an over-the-counter allergy medicine with Sudafed in it.  I did instruct him to discontinue this and monitor his blood pressures at home and follow-up with his PCP if his blood pressures remain elevated as he is not currently on any antihypertensive medications.  He is nontoxic in appearance.  Tolerating secretions without difficulty.  No evidence of strep pharyngitis, peritonsillar abscess, retropharyngeal abscess.  Lungs are clear to auscultation bilaterally.  I have a low suspicion of pneumonia in the absence of fever and given his symptoms to him seem more consistent with his seasonal allergies and/or sinusitis.  Given the duration of symptoms, I feel it is reasonable to treat for sinusitis with antibiotics.  He was also given Flonase in the ED to take home.  We will also give a prescription for Zyrtec.  Discussed symptomatic management.  Recommend follow-up with PCP if symptoms persist.  Discussed strict ED return precautions. Pt verbalized understanding of and agreement with plan and is safe for discharge home at this time.   Final Clinical Impressions(s) / ED Diagnoses   Final diagnoses:  Acute pansinusitis, recurrence not specified  Cough  Nasal congestion  Sore throat  Hypertension, unspecified type    ED Discharge Orders         Ordered    amoxicillin (AMOXIL) 500 MG capsule  3 times daily     01/30/19 1805    cetirizine (ZYRTEC  ALLERGY) 10 MG tablet  Daily     01/30/19 1805           Bennye Alm 01/30/19 Carlis Stable    Arby Barrette, MD 02/03/19 [phone]

## 2019-01-30 NOTE — Discharge Instructions (Addendum)
Please take all of your antibiotics until finished!   You may develop abdominal discomfort or diarrhea from the antibiotic.  You may help offset this with probiotics which you can buy or get in yogurt. Do not eat  or take the probiotics until 2 hours after your antibiotic.   Plenty of water and get plenty of rest. Gargle warm salt water and spit it out for sore throat. May also use cough drops, warm teas, etc. Take flonase to decrease nasal congestion. Zyrtec for nasal congestion and scratchy throat.  Continue taking naproxen twice daily with food as needed for pain/fever.  You can also take Tylenol every 6 hours.  Followup with your primary care doctor in 5-7 days for recheck of ongoing symptoms. Return to emergency department for emergent changing or worsening of symptoms such as throat tightness, facial swelling, fever not controlled by ibuprofen or Tylenol,difficulty breathing, or chest pain.  If your blood pressure (BP) was elevated on multiple readings during this visit above 130 for the top number or above 80 for the bottom number, please have this repeated by your primary care provider within one month. You can also check your blood pressure when you are out at a pharmacy or grocery store. Many have machines that will check your blood pressure.  If your blood pressure remains elevated, please follow-up with your PCP.  Avoid taking any medicines with Sudafed (pseudoephedrine) in it.

## 2019-01-30 NOTE — ED Triage Notes (Signed)
Patient c/o nasal congestion, cough, sinus pain, headache and body aches intermittently x 1 month.

## 2019-05-30 ENCOUNTER — Emergency Department (HOSPITAL_COMMUNITY)
Admission: EM | Admit: 2019-05-30 | Discharge: 2019-05-30 | Disposition: A | Discharge status: Home / Self Care | Payer: Self-pay | Attending: Emergency Medicine | Admitting: Emergency Medicine

## 2019-05-30 ENCOUNTER — Encounter (HOSPITAL_COMMUNITY): Payer: Self-pay | Admitting: Emergency Medicine

## 2019-05-30 ENCOUNTER — Other Ambulatory Visit: Payer: Self-pay

## 2019-05-30 DIAGNOSIS — J0101 Acute recurrent maxillary sinusitis: Secondary | ICD-10-CM | POA: Insufficient documentation

## 2019-05-30 DIAGNOSIS — Z79899 Other long term (current) drug therapy: Secondary | ICD-10-CM | POA: Insufficient documentation

## 2019-05-30 MED ORDER — AZITHROMYCIN 250 MG PO TABS: 250.0000 mg | ORAL_TABLET | Freq: Every day | ORAL | 0 refills | Status: AC

## 2019-05-30 MED ORDER — PSEUDOEPHEDRINE HCL 60 MG PO TABS
30.0000 mg | ORAL_TABLET | Freq: Once | ORAL | Status: AC
  Administered 2019-05-30: 20:00:00 30 mg via ORAL
  Filled 2019-05-30: qty 1

## 2019-05-30 MED ORDER — PREDNISONE 20 MG PO TABS: 40.0000 mg | ORAL_TABLET | Freq: Every day | ORAL | 0 refills | Status: AC

## 2019-05-30 MED ORDER — AZITHROMYCIN 250 MG PO TABS
500.0000 mg | ORAL_TABLET | Freq: Once | ORAL | Status: AC
  Administered 2019-05-30: 20:00:00 500 mg via ORAL
  Filled 2019-05-30: qty 2

## 2019-05-30 MED ORDER — PSEUDOEPHEDRINE HCL ER 120 MG PO TB12: 120.0000 mg | ORAL_TABLET | Freq: Two times a day (BID) | ORAL | 0 refills | Status: DC

## 2019-05-30 MED ORDER — PREDNISONE 20 MG PO TABS
60.0000 mg | ORAL_TABLET | ORAL | Status: AC
  Administered 2019-05-30: 20:00:00 60 mg via ORAL
  Filled 2019-05-30: qty 3

## 2019-05-30 NOTE — ED Triage Notes (Signed)
Patient c/o "sinus pain" with bilateral ear pain and nasal congestion x 4 weeks. Also c/o dysuria and "dark urine" x4 days.

## 2019-05-30 NOTE — ED Provider Notes (Signed)
[facility]-EMERGENCY DEPT Provider Note   CSN: [id number] Arrival date & time: 05/30/19  1913     History   Chief Complaint Chief Complaint  Patient presents with  . Facial Pain  . Nasal Congestion  . Dysuria    HPI Ricardo [name] is a 58 y.o. male.     HPI Presents with 4 weeks of facial pain, drainage, throat congestion. Patient notes history of similar prior episodes, and during this 1 has had no relief in spite of taking his typical regimen of multiple vitamins, occasional decongestant. No fever, no vomiting, no confusion, no disorientation, no chest pain, no dyspnea. Patient has no regular physician. Past Medical History:  Diagnosis Date  . Allergy   . Shingles   . Sinus congestion     Patient Active Problem List   Diagnosis Date Noted  . Seasonal allergies 10/31/2011    Past Surgical History:  Procedure Laterality Date  . NO PAST SURGERIES          Home Medications    Prior to Admission medications   Medication Sig Start Date End Date Taking? Authorizing Provider  COD LIVER OIL PO Take by mouth daily.   Yes [provider]  Multiple Vitamin (MULTIVITAMIN) tablet Take 1 tablet by mouth daily.   Yes [provider]  Omega-3 Fatty Acids (FISH OIL) 1000 MG CAPS Take by mouth.   Yes [provider]  albuterol (PROVENTIL HFA;VENTOLIN HFA) 108 (90 Base) MCG/ACT inhaler Inhale 1-2 puffs into the lungs every 6 (six) hours as needed for wheezing or shortness of breath (or cough/congestion/etc). Patient not taking: Reported on 07/09/2018 05/27/17   Street, FelicityMercedes, PA-C  amoxicillin-clavulanate (AUGMENTIN) 875-125 MG tablet Take 1 tablet by mouth every 12 (twelve) hours. 07/09/18   Elpidio AnisUpstill, Shari, PA-C  azithromycin (ZITHROMAX) 250 MG tablet Take 1 tablet (250 mg total) by mouth daily for 4 days. Take 1 every day until finished. 05/30/19 06/03/19  Gerhard MunchLockwood, Allyssia Skluzacek, MD  benzonatate (TESSALON) 100 MG capsule Take 2 capsules  (200 mg total) by mouth every 8 (eight) hours. Patient not taking: Reported on 07/09/2018 02/05/18   Dietrich PatesKhatri, Hina, PA-C  cetirizine (ZYRTEC ALLERGY) 10 MG tablet Take 1 tablet (10 mg total) by mouth daily. Patient not taking: Reported on 05/30/2019 01/30/19   Michela PitcherFawze, Mina A, PA-C  fluticasone (FLONASE) 50 MCG/ACT nasal spray Place 1 spray into both nostrils daily. Patient not taking: Reported on 05/30/2019 02/05/18   Dietrich PatesKhatri, Hina, PA-C  predniSONE (DELTASONE) 20 MG tablet Take 2 tablets (40 mg total) by mouth daily with breakfast for 3 days. For the next four days 05/30/19 06/02/19  Gerhard MunchLockwood, Christifer Chapdelaine, MD  pseudoephedrine (SUDAFED 12 HOUR) 120 MG 12 hr tablet Take 1 tablet (120 mg total) by mouth 2 (two) times daily. Use twice daily for five days 05/30/19   Gerhard MunchLockwood, Gotham Raden, MD    Family History Family History  Problem Relation Age of Onset  . Hyperlipidemia Father   . Colon cancer Neg Hx   . Colon polyps Neg Hx   . Esophageal cancer Neg Hx   . Rectal cancer Neg Hx   . Stomach cancer Neg Hx     Social History Social History   Tobacco Use  . Smoking status: Never Smoker  . Smokeless tobacco: Never Used  Substance Use Topics  . Alcohol use: No  . Drug use: No     Allergies   Patient has no known allergies.   Review of Systems Review of Systems  Constitutional:       Per HPI, otherwise negative  HENT:       Per HPI, otherwise negative  Respiratory:       Per HPI, otherwise negative  Cardiovascular:       Per HPI, otherwise negative  Gastrointestinal: Negative for vomiting.  Endocrine:       Negative aside from HPI  Genitourinary:       Neg aside from HPI   Musculoskeletal:       Per HPI, otherwise negative  Skin: Negative.   Neurological: Negative for syncope.     Physical Exam Updated Vital Signs BP (!) 147/105 (BP Location: Left Arm)   Pulse 98   Temp 98.3 F (36.8 C) (Oral)   Resp 18   Ht 5\' 8"  (1.727 m)   Wt 86.2 kg   SpO2 96%   BMI 28.89 kg/m   Physical Exam  Vitals signs and nursing note reviewed.  Constitutional:      General: He is not in acute distress.    Appearance: He is well-developed.  HENT:     Head: Normocephalic and atraumatic.     Comments: Trace fluid bilateral TM otherwise unremarkable    Right Ear: External ear normal. There is no impacted cerumen.     Left Ear: External ear normal. There is no impacted cerumen.     Mouth/Throat:     Mouth: Mucous membranes are dry.     Pharynx: No oropharyngeal exudate or posterior oropharyngeal erythema.     Comments: Fullness oropharynx, without asymmetry, without exudate, without substantial erythema or bleeding Eyes:     Conjunctiva/sclera: Conjunctivae normal.  Neck:   Cardiovascular:     Rate and Rhythm: Normal rate and regular rhythm.  Pulmonary:     Effort: Pulmonary effort is normal. No respiratory distress.     Breath sounds: No stridor.  Abdominal:     General: There is no distension.  Skin:    General: Skin is warm and dry.  Neurological:     Mental Status: He is alert and oriented to person, place, and time.      ED Treatments / Results   Procedures Procedures (including critical care time)  Medications Ordered in ED Medications  azithromycin (ZITHROMAX) tablet 500 mg (500 mg Oral Given 05/30/19 2016)  predniSONE (DELTASONE) tablet 60 mg (60 mg Oral Given 05/30/19 2016)  pseudoephedrine (SUDAFED) tablet 30 mg (30 mg Oral Given 05/30/19 2017)     Initial Impression / Assessment and Plan / ED Course  I have reviewed the triage vital signs and the nursing notes.  Pertinent labs & imaging results that were available during my care of the patient were reviewed by me and considered in my medical decision making (see chart for details).  Chart review notable for several similar prior presentations over the past 2 years.  This well-appearing adult male presents with 1 month of sinus pain, congestion Patient is awake, alert, afebrile, low suspicion for bacteremia,  sepsis, COVID. Patient likely has sinus inflammation, started on appropriate medication here, discharged in stable condition.  Final Clinical Impressions(s) / ED Diagnoses   Final diagnoses:  Acute recurrent maxillary sinusitis    ED Discharge Orders         Ordered    azithromycin (ZITHROMAX) 250 MG tablet  Daily     05/30/19 2027    pseudoephedrine (SUDAFED 12 HOUR) 120 MG 12 hr tablet  2 times daily     05/30/19 2027    predniSONE (  DELTASONE) 20 MG tablet  Daily with breakfast     05/30/19 2027           Carmin Muskrat, MD 05/30/19 2030

## 2019-05-30 NOTE — Discharge Instructions (Addendum)
As discussed, it is important that you take the medication as prescribed, monitor your condition carefully and do not hesitate to return here for concerning changes in your condition.

## 2019-08-31 ENCOUNTER — Emergency Department (HOSPITAL_COMMUNITY): Payer: Self-pay

## 2019-08-31 ENCOUNTER — Other Ambulatory Visit: Payer: Self-pay

## 2019-08-31 ENCOUNTER — Emergency Department (HOSPITAL_COMMUNITY)
Admission: EM | Admit: 2019-08-31 | Discharge: 2019-08-31 | Disposition: A | Discharge status: Home / Self Care | Payer: Self-pay | Attending: Emergency Medicine | Admitting: Emergency Medicine

## 2019-08-31 ENCOUNTER — Encounter (HOSPITAL_COMMUNITY): Payer: Self-pay

## 2019-08-31 DIAGNOSIS — Z79899 Other long term (current) drug therapy: Secondary | ICD-10-CM | POA: Insufficient documentation

## 2019-08-31 DIAGNOSIS — J0100 Acute maxillary sinusitis, unspecified: Secondary | ICD-10-CM | POA: Insufficient documentation

## 2019-08-31 MED ORDER — AMOXICILLIN-POT CLAVULANATE 875-125 MG PO TABS: 1.0000 | ORAL_TABLET | Freq: Two times a day (BID) | ORAL | 0 refills | Status: DC

## 2019-08-31 MED ORDER — FLUTICASONE PROPIONATE 50 MCG/ACT NA SUSP: 1.0000 | Freq: Every day | NASAL | 0 refills | Status: DC

## 2019-08-31 NOTE — ED Triage Notes (Signed)
Patient c/o respiratory allergies, nasal congestion, productive cough with brown sputum x 1 month.

## 2019-08-31 NOTE — ED Notes (Signed)
X-ray at bedside

## 2019-08-31 NOTE — ED Provider Notes (Signed)
[facility]-EMERGENCY DEPT Provider Note   CSN: [id number] Arrival date & time: 08/31/19  1144     History   Chief Complaint Chief Complaint  Patient presents with  . respiratory allergies  . Cough  . Nasal Congestion    HPI Ricardo [name] is a 58 y.o. male.     The history is provided by the patient and medical records. No language interpreter was used.  Cough    58 year old male with history of seasonal allergies, no history of tobacco use or alcohol use presenting complaining of sinus congestion.  Patient does report for the past month he has had facial pain, sinus congestion, persistent sneezing, cough productive with brown sputum.  No associated fever or chills no shortness of breath and no neck pain.  Mild throat discomfort.  He has been trying to use naproxen, Zyrtec's,'s nasal spray, without adequate relief.  He mention he has had similar symptoms like this in the past.  He denies any recent sick contact with anyone with COVID-19.  Symptoms moderate in severity and have been persistent.  Past Medical History:  Diagnosis Date  . Allergy   . Shingles   . Sinus congestion     Patient Active Problem List   Diagnosis Date Noted  . Seasonal allergies 10/31/2011    Past Surgical History:  Procedure Laterality Date  . NO PAST SURGERIES          Home Medications    Prior to Admission medications   Medication Sig Start Date End Date Taking? Authorizing Provider  albuterol (PROVENTIL HFA;VENTOLIN HFA) 108 (90 Base) MCG/ACT inhaler Inhale 1-2 puffs into the lungs every 6 (six) hours as needed for wheezing or shortness of breath (or cough/congestion/etc). Patient not taking: Reported on 07/09/2018 05/27/17   Street, Ponce InletMercedes, PA-C  amoxicillin-clavulanate (AUGMENTIN) 875-125 MG tablet Take 1 tablet by mouth every 12 (twelve) hours. Patient not taking: Reported on 05/30/2019 07/09/18   Elpidio AnisUpstill, Shari, PA-C  aspirin EC 81 MG tablet Take 81 mg by mouth  at bedtime as needed for moderate pain.    [provider]  benzonatate (TESSALON) 100 MG capsule Take 2 capsules (200 mg total) by mouth every 8 (eight) hours. Patient not taking: Reported on 07/09/2018 02/05/18   Dietrich PatesKhatri, Hina, PA-C  cetirizine (ZYRTEC ALLERGY) 10 MG tablet Take 1 tablet (10 mg total) by mouth daily. Patient not taking: Reported on 05/30/2019 01/30/19   Michela PitcherFawze, Mina A, PA-C  COD LIVER OIL PO Take by mouth daily.    [provider]  fluticasone (FLONASE) 50 MCG/ACT nasal spray Place 1 spray into both nostrils daily. Patient not taking: Reported on 05/30/2019 02/05/18   Dietrich PatesKhatri, Hina, PA-C  Multiple Vitamin (MULTIVITAMIN) tablet Take 1 tablet by mouth daily.    [provider]  naproxen sodium (ALEVE) 220 MG tablet Take 220 mg by mouth daily as needed (pain).    [provider]  Omega-3 Fatty Acids (FISH OIL) 1000 MG CAPS Take by mouth.    [provider]  pseudoephedrine (SUDAFED 12 HOUR) 120 MG 12 hr tablet Take 1 tablet (120 mg total) by mouth 2 (two) times daily. Use twice daily for five days 05/30/19   Gerhard MunchLockwood, Robert, MD    Family History Family History  Problem Relation Age of Onset  . Hyperlipidemia Father   . Colon cancer Neg Hx   . Colon polyps Neg Hx   . Esophageal cancer Neg Hx   . Rectal cancer Neg Hx   .  Stomach cancer Neg Hx     Social History Social History   Tobacco Use  . Smoking status: Never Smoker  . Smokeless tobacco: Never Used  Substance Use Topics  . Alcohol use: No  . Drug use: No     Allergies   Patient has no known allergies.   Review of Systems Review of Systems  Respiratory: Positive for cough.   All other systems reviewed and are negative.    Physical Exam Updated Vital Signs BP (!) 145/105   Pulse 64   Temp 98.8 F (37.1 C) (Oral)   Resp 16   Ht 5\' 8"  (1.727 m)   Wt 84.8 kg   SpO2 100%   BMI 28.43 kg/m   Physical Exam Vitals signs and nursing note reviewed.  Constitutional:       General: He is not in acute distress.    Appearance: He is well-developed.  HENT:     Head: Atraumatic.     Right Ear: Tympanic membrane normal.     Left Ear: Tympanic membrane normal.     Nose: Nose normal.     Mouth/Throat:     Mouth: Mucous membranes are moist.  Eyes:     Conjunctiva/sclera: Conjunctivae normal.  Neck:     Musculoskeletal: Normal range of motion and neck supple. No neck rigidity.  Cardiovascular:     Rate and Rhythm: Normal rate and regular rhythm.     Pulses: Normal pulses.     Heart sounds: Normal heart sounds.  Pulmonary:     Effort: Pulmonary effort is normal.     Breath sounds: Normal breath sounds. No wheezing, rhonchi or rales.  Abdominal:     Palpations: Abdomen is soft.     Tenderness: There is no abdominal tenderness.  Lymphadenopathy:     Cervical: No cervical adenopathy.  Skin:    Findings: No rash.  Neurological:     Mental Status: He is alert and oriented to person, place, and time.  Psychiatric:        Mood and Affect: Mood normal.      ED Treatments / Results  Labs (all labs ordered are listed, but only abnormal results are displayed) Labs Reviewed - No data to display  EKG None  Radiology No results found.  Procedures Procedures (including critical care time)  Medications Ordered in ED Medications - No data to display   Initial Impression / Assessment and Plan / ED Course  I have reviewed the triage vital signs and the nursing notes.  Pertinent labs & imaging results that were available during my care of the patient were reviewed by me and considered in my medical decision making (see chart for details).        BP (!) 141/109   Pulse 63   Temp 98.8 F (37.1 C) (Oral)   Resp 16   Ht 5\' 8"  (1.727 m)   Wt 84.8 kg   SpO2 100%   BMI 28.43 kg/m    Final Clinical Impressions(s) / ED Diagnoses   Final diagnoses:  Acute maxillary sinusitis, recurrence not specified    ED Discharge Orders         Ordered     amoxicillin-clavulanate (AUGMENTIN) 875-125 MG tablet  Every 12 hours     08/31/19 1902    fluticasone (FLONASE) 50 MCG/ACT nasal spray  Daily     08/31/19 1902         6:33 PM Patient here with sinus congestion as well as productive cough  ongoing for the past month.  Symptoms seems to not managed well with over-the-counter allergies medication.  Patient is afebrile, no hypoxia.  Facial exam unremarkable.  Chest x-ray unremarkable.  Given the prolonged duration of his symptoms, will prescribe Augmentin to treat for potential underlying sinusitis as patient does have some mild facial tenderness.   Domenic Moras, PA-C 08/31/19 Mercy Moore, MD 09/01/19 1017

## 2019-08-31 NOTE — ED Notes (Signed)
An After Visit Summary was printed and given to the patient. Discharge instructions given and no further questions at this time.  

## 2019-11-17 ENCOUNTER — Other Ambulatory Visit: Payer: Self-pay

## 2019-11-17 ENCOUNTER — Emergency Department (HOSPITAL_COMMUNITY)
Admission: EM | Admit: 2019-11-17 | Discharge: 2019-11-17 | Disposition: A | Discharge status: Home / Self Care | Payer: Self-pay | Attending: Emergency Medicine | Admitting: Emergency Medicine

## 2019-11-17 ENCOUNTER — Encounter (HOSPITAL_COMMUNITY): Payer: Self-pay | Admitting: *Deleted

## 2019-11-17 DIAGNOSIS — J0101 Acute recurrent maxillary sinusitis: Secondary | ICD-10-CM | POA: Insufficient documentation

## 2019-11-17 DIAGNOSIS — H9203 Otalgia, bilateral: Secondary | ICD-10-CM | POA: Insufficient documentation

## 2019-11-17 MED ORDER — AMOXICILLIN-POT CLAVULANATE 875-125 MG PO TABS: 1.0000 | ORAL_TABLET | Freq: Two times a day (BID) | ORAL | 0 refills | Status: DC

## 2019-11-17 MED ORDER — CETIRIZINE HCL 10 MG PO TABS: 10.0000 mg | ORAL_TABLET | Freq: Every day | ORAL | 0 refills | Status: DC

## 2019-11-17 MED ORDER — CETIRIZINE HCL 5 MG/5ML PO SOLN
5.0000 mg | Freq: Once | ORAL | Status: AC
  Administered 2019-11-17: 20:00:00 5 mg via ORAL
  Filled 2019-11-17: qty 5

## 2019-11-17 MED ORDER — CETIRIZINE-PSEUDOEPHEDRINE ER 5-120 MG PO TB12: 1.0000 | ORAL_TABLET | Freq: Every day | ORAL | 0 refills | Status: DC

## 2019-11-17 MED ORDER — AMOXICILLIN-POT CLAVULANATE 875-125 MG PO TABS
1.0000 | ORAL_TABLET | Freq: Once | ORAL | Status: AC
  Administered 2019-11-17: 20:00:00 1 via ORAL
  Filled 2019-11-17: qty 1

## 2019-11-17 NOTE — ED Provider Notes (Signed)
[facility]-EMERGENCY DEPT Provider Note   CSN: [id number] Arrival date & time: 11/17/19  1759     History Chief Complaint  Patient presents with  . Facial Pain  . Sore Throat    Ricardo [name] is a 58 y.o. male.  HPI Patient has a long history of recurrent sinusitis presented today with sore throat, congestion, bilateral ear pain, ear fullness, facial pain, runny nose, he has a history of allergies and states that he feels his allergies result in bacterial infections proximately once per year.  States he had a similar presentation and back in September.  States that his symptoms improved with Augmentin.  Patient denies any fevers or chills.  States that he has severe, constant, unremitting facial pain and pressure.  Denies any difficulty hearing.  Denies any discharge from his ears.  Patient states he is unsure if he has been exposed to anyone with Covid but declines any testing today.     Past Medical History:  Diagnosis Date  . Allergy   . Shingles   . Sinus congestion     Patient Active Problem List   Diagnosis Date Noted  . Seasonal allergies 10/31/2011    Past Surgical History:  Procedure Laterality Date  . NO PAST SURGERIES         Family History  Problem Relation Age of Onset  . Hyperlipidemia Father   . Colon cancer Neg Hx   . Colon polyps Neg Hx   . Esophageal cancer Neg Hx   . Rectal cancer Neg Hx   . Stomach cancer Neg Hx     Social History   Tobacco Use  . Smoking status: Never Smoker  . Smokeless tobacco: Never Used  Substance Use Topics  . Alcohol use: No  . Drug use: No    Home Medications Prior to Admission medications   Medication Sig Start Date End Date Taking? Authorizing Provider  amoxicillin-clavulanate (AUGMENTIN) 875-125 MG tablet Take 1 tablet by mouth every 12 (twelve) hours. 11/17/19   Gailen Shelter, PA  aspirin EC 81 MG tablet Take 81 mg by mouth at bedtime as needed for moderate pain.    [provider]  cetirizine (ZYRTEC ALLERGY) 10 MG tablet Take 1 tablet (10 mg total) by mouth daily for 7 days. 11/17/19 11/24/19  Gailen Shelter, PA  COD LIVER OIL PO Take by mouth daily.    [provider]  fluticasone (FLONASE) 50 MCG/ACT nasal spray Place 1 spray into both nostrils daily. 08/31/19   Fayrene Helper, PA-C  Multiple Vitamin (MULTIVITAMIN) tablet Take 1 tablet by mouth daily.    [provider]  naproxen sodium (ALEVE) 220 MG tablet Take 220 mg by mouth daily as needed (pain).    [provider]  Omega-3 Fatty Acids (FISH OIL) 1000 MG CAPS Take by mouth.    [provider]  pseudoephedrine (SUDAFED 12 HOUR) 120 MG 12 hr tablet Take 1 tablet (120 mg total) by mouth 2 (two) times daily. Use twice daily for five days 05/30/19   Gerhard Munch, MD  albuterol (PROVENTIL HFA;VENTOLIN HFA) 108 (90 Base) MCG/ACT inhaler Inhale 1-2 puffs into the lungs every 6 (six) hours as needed for wheezing or shortness of breath (or cough/congestion/etc). Patient not taking: Reported on 07/09/2018 05/27/17 08/31/19  Street, [place], [place]    Allergies    Patient has no known allergies.  Review of Systems   Review of Systems  Constitutional: Negative for chills and fever.  HENT: Positive for congestion, ear pain, postnasal drip, rhinorrhea, sinus pressure, sinus pain and sore throat.   Respiratory: Negative for shortness of breath.   Cardiovascular: Negative for chest pain.  Gastrointestinal: Negative for abdominal pain.  Musculoskeletal: Negative for neck pain.  Skin: Negative for rash.    Physical Exam Updated Vital Signs BP (!) 163/113   Pulse 87   Temp 98.7 F (37.1 C) (Oral)   Resp 18   SpO2 100%   Physical Exam Vitals and nursing note reviewed.  Constitutional:      General: He is not in acute distress.    Appearance: Normal appearance. He is not ill-appearing.  HENT:     Head: Normocephalic and atraumatic.     Ears:     Comments: TM and EAC  without abnormality.  No cerumen impaction.    Nose: Congestion and rhinorrhea present.     Comments: Maxillary and frontal sinus tenderness to palpation    Mouth/Throat:     Comments: Moist mucous membranes, no posterior pharynx erythema, clear PND visible posterior pharynx.  No exudate or tonsillar swelling Eyes:     General: No scleral icterus.       Right eye: No discharge.        Left eye: No discharge.     Conjunctiva/sclera: Conjunctivae normal.  Cardiovascular:     Rate and Rhythm: Normal rate and regular rhythm.     Pulses: Normal pulses.     Heart sounds: Normal heart sounds.  Pulmonary:     Effort: Pulmonary effort is normal.     Breath sounds: No stridor. No wheezing or rhonchi.  Musculoskeletal:        General: Normal range of motion.     Cervical back: Normal range of motion.     Right lower leg: No edema.     Left lower leg: No edema.  Skin:    General: Skin is warm and dry.  Neurological:     Mental Status: He is alert and oriented to person, place, and time. Mental status is at baseline.  Psychiatric:        Mood and Affect: Mood normal.     ED Results / Procedures / Treatments   Labs (all labs ordered are listed, but only abnormal results are displayed) Labs Reviewed - No data to display  EKG None  Radiology No results found.  Procedures Procedures (including critical care time)  Medications Ordered in ED Medications  amoxicillin-clavulanate (AUGMENTIN) 875-125 MG per tablet 1 tablet (1 tablet Oral Given 11/17/19 1943)  cetirizine HCl (Zyrtec) 5 MG/5ML solution 5 mg (5 mg Oral Given 11/17/19 1943)    ED Course  I have reviewed the triage vital signs and the nursing notes.  Pertinent labs & imaging results that were available during my care of the patient were reviewed by me and considered in my medical decision making (see chart for details).    MDM Rules/Calculators/A&P  Patient is 58 year old male presented today with symptoms consistent  with sinus infection which have been going on for 3 weeks.  They have been constant.  Patient has history of allergies but this began as acute worsening of allergies however suspect that he may have a superimposed bacterial infection.  He has tenderness to palpation of his maxillary and frontal sinuses.  Is afebrile however has had symptoms for some time and has had good response to therapy in the past.  Recommended he use Zyrtec for his symptoms.  Recommended refrain from Sudafed as  he has elevated blood pressure.  Recommended conservative therapy including home remedies mentioned in discharge instructions.  Patient understanding of instruction.  Return to ED if he has any new or concerning symptoms.  Doubt preseptal or post septal cellulitis.  Doubt malignant otitis externa as patient does not have any tenderness behind his ear is now external ear pain.  ADI DORO was evaluated in Emergency Department on 11/17/2019 for the symptoms described in the history of present illness. He was evaluated in the context of the global COVID-19 pandemic, which necessitated consideration that the patient might be at risk for infection with the SARS-CoV-2 virus that causes COVID-19. Institutional protocols and algorithms that pertain to the evaluation of patients at risk for COVID-19 are in a state of rapid change based on information released by regulatory bodies including the CDC and federal and state organizations. These policies and algorithms were followed during the patient's care in the ED.   Final Clinical Impression(s) / ED Diagnoses Final diagnoses:  Acute recurrent maxillary sinusitis    Rx / DC Orders ED Discharge Orders         Ordered    amoxicillin-clavulanate (AUGMENTIN) 875-125 MG tablet  Every 12 hours     11/17/19 1932    cetirizine-pseudoephedrine (ZYRTEC-D) 5-120 MG tablet  Daily,   Status:  Discontinued     11/17/19 1932    cetirizine (ZYRTEC ALLERGY) 10 MG tablet  Daily     11/17/19  2006           Pati Gallo [place], [place] 11/17/19 2007    Varney Biles, MD 11/23/19 1831

## 2019-11-17 NOTE — ED Notes (Signed)
An After Visit Summary was printed and given to the patient. Discharge instructions given and no further questions at this time.  

## 2019-11-17 NOTE — Discharge Instructions (Addendum)
Please continue to do home sinus rinses, cough drops, Flonase  I am prescribing you Augmentin with you to take by mouth every 12 hours.  Please take Zyrtec once daily in the morning.  Please use Flonase regularly twice a day for 2 days and then once a day from then on.  Please follow-up with your primary care doctor for reevaluation.

## 2019-11-17 NOTE — ED Triage Notes (Signed)
Pt feels like his allergies are flaring up. He has sore throat, bilateral ear pain, facial pain, congestion. Pt states he has hx of allergies. Symptoms worse at night. No sick contacts

## 2020-01-06 ENCOUNTER — Other Ambulatory Visit: Payer: Self-pay

## 2020-01-06 ENCOUNTER — Encounter (HOSPITAL_COMMUNITY): Payer: Self-pay

## 2020-01-06 DIAGNOSIS — R0981 Nasal congestion: Secondary | ICD-10-CM | POA: Insufficient documentation

## 2020-01-06 NOTE — ED Triage Notes (Signed)
Pt c/o sinus infection, congestion, urinary frequency and burning x2 weeks.

## 2020-01-07 ENCOUNTER — Emergency Department (HOSPITAL_COMMUNITY)
Admission: EM | Admit: 2020-01-07 | Discharge: 2020-01-07 | Disposition: A | Discharge status: Home / Self Care | Payer: Self-pay | Attending: Emergency Medicine | Admitting: Emergency Medicine

## 2020-01-07 DIAGNOSIS — R0981 Nasal congestion: Secondary | ICD-10-CM

## 2020-01-07 LAB — URINALYSIS, ROUTINE W REFLEX MICROSCOPIC
Bilirubin Urine: NEGATIVE
Glucose, UA: NEGATIVE mg/dL
Hgb urine dipstick: NEGATIVE
Ketones, ur: NEGATIVE mg/dL
Leukocytes,Ua: NEGATIVE
Nitrite: NEGATIVE
Protein, ur: NEGATIVE mg/dL
Specific Gravity, Urine: 1.025 (ref 1.005–1.030)
pH: 6 (ref 5.0–8.0)

## 2020-01-07 MED ORDER — AMOXICILLIN-POT CLAVULANATE 875-125 MG PO TABS: 1.0000 | ORAL_TABLET | Freq: Two times a day (BID) | ORAL | 0 refills | Status: DC

## 2020-01-07 MED ORDER — METHYLPREDNISOLONE 4 MG PO TBPK: ORAL_TABLET | ORAL | 0 refills | Status: DC

## 2020-01-07 MED ORDER — AMOXICILLIN-POT CLAVULANATE 875-125 MG PO TABS
1.0000 | ORAL_TABLET | Freq: Once | ORAL | Status: AC
  Administered 2020-01-07: 1 via ORAL
  Filled 2020-01-07: qty 1

## 2020-01-07 NOTE — ED Provider Notes (Signed)
[facility]-EMERGENCY DEPT Provider Note  CSN: [id number] Arrival date & time: 01/06/20 2119  Chief Complaint(s) Recurrent Sinusitis and Urinary Tract Infection  HPI Ricardo [name] is a 59 y.o. male   The history is provided by the patient.  URI Presenting symptoms: congestion, rhinorrhea and sore throat   Severity:  Moderate Onset quality:  Gradual Duration:  2 weeks Timing:  Constant Progression:  Waxing and waning Chronicity:  Recurrent Relieved by:  Nothing Worsened by:  Nothing Ineffective treatments:  Decongestant and OTC medications Associated symptoms: headaches and sinus pain    Also was concerned for UTI.  Past Medical History Past Medical History:  Diagnosis Date  . Allergy   . Shingles   . Sinus congestion    Patient Active Problem List   Diagnosis Date Noted  . Seasonal allergies 10/31/2011   Home Medication(s) Prior to Admission medications   Medication Sig Start Date End Date Taking? Authorizing Provider  amoxicillin-clavulanate (AUGMENTIN) 875-125 MG tablet Take 1 tablet by mouth every 12 (twelve) hours. 01/07/20   Nira Conn, MD  aspirin EC 81 MG tablet Take 81 mg by mouth at bedtime as needed for moderate pain.    [provider]  cetirizine (ZYRTEC ALLERGY) 10 MG tablet Take 1 tablet (10 mg total) by mouth daily for 7 days. 11/17/19 11/24/19  Gailen Shelter, PA  COD LIVER OIL PO Take by mouth daily.    [provider]  fluticasone (FLONASE) 50 MCG/ACT nasal spray Place 1 spray into both nostrils daily. 08/31/19   Fayrene Helper, PA-C  methylPREDNISolone (MEDROL DOSEPAK) 4 MG TBPK tablet Use as directed on the package 01/07/20   Nira Conn, MD  Multiple Vitamin (MULTIVITAMIN) tablet Take 1 tablet by mouth daily.    [provider]  naproxen sodium (ALEVE) 220 MG tablet Take 220 mg by mouth daily as needed (pain).    [provider]  Omega-3 Fatty Acids (FISH OIL) 1000 MG CAPS  Take by mouth.    [provider]  pseudoephedrine (SUDAFED 12 HOUR) 120 MG 12 hr tablet Take 1 tablet (120 mg total) by mouth 2 (two) times daily. Use twice daily for five days 05/30/19   Gerhard Munch, MD  albuterol (PROVENTIL HFA;VENTOLIN HFA) 108 (90 Base) MCG/ACT inhaler Inhale 1-2 puffs into the lungs every 6 (six) hours as needed for wheezing or shortness of breath (or cough/congestion/etc). Patient not taking: Reported on 07/09/2018 05/27/17 08/31/19  Street, [place], [place]                                                                                                                                    Past Surgical History Past Surgical History:  Procedure Laterality Date  . NO PAST SURGERIES     Family History Family History  Problem Relation Age of Onset  . Hyperlipidemia Father   . Colon cancer Neg Hx   .  Colon polyps Neg Hx   . Esophageal cancer Neg Hx   . Rectal cancer Neg Hx   . Stomach cancer Neg Hx     Social History Social History   Tobacco Use  . Smoking status: Never Smoker  . Smokeless tobacco: Never Used  Substance Use Topics  . Alcohol use: No  . Drug use: No   Allergies Patient has no known allergies.  Review of Systems Review of Systems  HENT: Positive for congestion, rhinorrhea, sinus pain and sore throat.   Neurological: Positive for headaches.   All other systems are reviewed and are negative for acute change except as noted in the HPI  Physical Exam Vital Signs  I have reviewed the triage vital signs BP (!) 149/106   Pulse 70   Temp 98.3 F (36.8 C) (Oral)   Resp 18   Ht 5\' 7"  (1.702 m)   Wt 84.8 kg   SpO2 100%   BMI 29.29 kg/m   Physical Exam Vitals reviewed.  Constitutional:      General: He is not in acute distress.    Appearance: He is well-developed. He is not diaphoretic.  HENT:     Head: Normocephalic and atraumatic.     Nose: Mucosal edema and rhinorrhea present.     Mouth/Throat:     Pharynx: No pharyngeal  swelling, oropharyngeal exudate or posterior oropharyngeal erythema.     Tonsils: No tonsillar exudate.     Comments: Post nasal drip Eyes:     General: No scleral icterus.       Right eye: No discharge.        Left eye: No discharge.     Conjunctiva/sclera: Conjunctivae normal.     Pupils: Pupils are equal, round, and reactive to light.  Cardiovascular:     Rate and Rhythm: Normal rate and regular rhythm.     Heart sounds: No murmur. No friction rub. No gallop.   Pulmonary:     Effort: Pulmonary effort is normal. No respiratory distress.     Breath sounds: Normal breath sounds. No stridor. No rales.  Abdominal:     General: There is no distension.     Palpations: Abdomen is soft.     Tenderness: There is no abdominal tenderness.  Musculoskeletal:        General: No tenderness.     Cervical back: Normal range of motion and neck supple.  Skin:    General: Skin is warm and dry.     Findings: No erythema or rash.  Neurological:     Mental Status: He is alert and oriented to person, place, and time.     ED Results and Treatments Labs (all labs ordered are listed, but only abnormal results are displayed) Labs Reviewed  URINALYSIS, ROUTINE W REFLEX MICROSCOPIC                                                                                                                         EKG  EKG Interpretation  Date/Time:    Ventricular Rate:    PR Interval:    QRS Duration:   QT Interval:    QTC Calculation:   R Axis:     Text Interpretation:        Radiology No results found.  Pertinent labs & imaging results that were available during my care of the patient were reviewed by me and considered in my medical decision making (see chart for details).  Medications Ordered in ED Medications  amoxicillin-clavulanate (AUGMENTIN) 875-125 MG per tablet 1 tablet (1 tablet Oral Given 01/07/20 0249)                                                                                                                                     Procedures Procedures  (including critical care time)  Medical Decision Making / ED Course I have reviewed the nursing notes for this encounter and the patient's prior records (if available in EHR or on provided paperwork).   JERRIE GULLO was evaluated in Emergency Department on 01/07/2020 for the symptoms described in the history of present illness. He was evaluated in the context of the global COVID-19 pandemic, which necessitated consideration that the patient might be at risk for infection with the SARS-CoV-2 virus that causes COVID-19. Institutional protocols and algorithms that pertain to the evaluation of patients at risk for COVID-19 are in a state of rapid change based on information released by regulatory bodies including the CDC and federal and state organizations. These policies and algorithms were followed during the patient's care in the ED.  Recurrent sinus congestion. No fever. No evidence of pharyngitis, AOM.   The patient appears well, in no acute distress, without evidence of toxicity or dehydration.   UA negative.  augmentin and medrol dose pack Rx.  The patient appears reasonably screened and/or stabilized for discharge and I doubt any other medical condition or other [facility] requiring further screening, evaluation, or treatment in the ED at this time prior to discharge.  The patient is safe for discharge with strict return precautions.        Final Clinical Impression(s) / ED Diagnoses Final diagnoses:  Nasal congestion    The patient appears reasonably screened and/or stabilized for discharge and I doubt any other medical condition or other [facility] requiring further screening, evaluation, or treatment in the ED at this time prior to discharge.  Disposition: Discharge  Condition: Good  I have discussed the results, Dx and Tx plan with the patient who expressed understanding and agree(s) with the plan. Discharge  instructions discussed at great length. The patient was given strict return precautions who verbalized understanding of the instructions. No further questions at time of discharge.    ED Discharge Orders         Ordered    amoxicillin-clavulanate (AUGMENTIN) 875-125 MG tablet  Every 12 hours     01/07/20 0237    methylPREDNISolone (  MEDROL DOSEPAK) 4 MG TBPK tablet     01/07/20 1740           Follow Up: Fleet Contras, MD [address] [place] [address] [phone]  Schedule an appointment as soon as possible for a visit        This chart was dictated using voice recognition software.  Despite best efforts to proofread,  errors can occur which can change the documentation meaning.   Nira Conn, MD 01/07/20 0330

## 2020-02-12 ENCOUNTER — Encounter (HOSPITAL_COMMUNITY): Payer: Self-pay

## 2020-02-12 ENCOUNTER — Other Ambulatory Visit: Payer: Self-pay

## 2020-02-12 DIAGNOSIS — J069 Acute upper respiratory infection, unspecified: Secondary | ICD-10-CM | POA: Insufficient documentation

## 2020-02-12 NOTE — ED Triage Notes (Signed)
Pt states, "these damn seasonal allergies, man." He denies taking any medication for his symptoms. Reports nasal congestion, burning eyes, and sneezing

## 2020-02-13 ENCOUNTER — Emergency Department (HOSPITAL_COMMUNITY)
Admission: EM | Admit: 2020-02-13 | Discharge: 2020-02-13 | Disposition: A | Discharge status: Home / Self Care | Payer: Self-pay | Attending: Emergency Medicine | Admitting: Emergency Medicine

## 2020-02-13 DIAGNOSIS — J069 Acute upper respiratory infection, unspecified: Secondary | ICD-10-CM

## 2020-02-13 MED ORDER — LORATADINE 10 MG PO TABS
10.0000 mg | ORAL_TABLET | ORAL | Status: AC
  Administered 2020-02-13: 10 mg via ORAL
  Filled 2020-02-13: qty 1

## 2020-02-13 MED ORDER — AMOXICILLIN-POT CLAVULANATE 875-125 MG PO TABS
1.0000 | ORAL_TABLET | Freq: Once | ORAL | Status: AC
  Administered 2020-02-13: 1 via ORAL
  Filled 2020-02-13: qty 1

## 2020-02-13 MED ORDER — CETIRIZINE-PSEUDOEPHEDRINE ER 5-120 MG PO TB12: 1.0000 | ORAL_TABLET | Freq: Every day | ORAL | 0 refills | Status: DC

## 2020-02-13 MED ORDER — CETIRIZINE HCL 5 MG/5ML PO SOLN: 5.0000 mg | Freq: Once | ORAL | Status: DC

## 2020-02-13 MED ORDER — FLUTICASONE PROPIONATE 50 MCG/ACT NA SUSP
2.0000 | Freq: Every day | NASAL | Status: DC
  Administered 2020-02-13: 01:00:00 2 via NASAL
  Filled 2020-02-13: qty 16

## 2020-02-13 MED ORDER — AMOXICILLIN-POT CLAVULANATE 875-125 MG PO TABS: 1.0000 | ORAL_TABLET | Freq: Two times a day (BID) | ORAL | 0 refills | Status: DC

## 2020-02-13 NOTE — ED Provider Notes (Signed)
Emergency Department Provider Note   I have reviewed the triage vital signs and the nursing notes.   HISTORY  Chief Complaint Nasal Congestion   HPI Ricardo [name] is a 59 y.o. male with history of allergies and sinusitis who presents to the emergency department today with 3 weeks of progressively worsening nasal drainage, postnasal drip.  Patient states that it was clear over the last couple days is turning yellow with little bit of bloody stuff.  No fever.  Has a bit of cough with a drip and sore throat but no other associated symptoms.  Has tried some over-the-counter medications at home (equate brand allergy) helps some but was concerned with this recent change.   No other associated or modifying symptoms.    Past Medical History:  Diagnosis Date  . Allergy   . Shingles   . Sinus congestion     Patient Active Problem List   Diagnosis Date Noted  . Seasonal allergies 10/31/2011    Past Surgical History:  Procedure Laterality Date  . NO PAST SURGERIES      Current Outpatient Rx  . Order #: [id number] Class: Normal  . Order #: [id number] Class: Historical Med  . Order #: [id number] Class: Normal  . Order #: [id number] Class: Historical Med  . Order #: [id number] Class: Print  . Order #: [id number] Class: Historical Med  . Order #: [id number] Class: Historical Med  . Order #: [id number] Class: Historical Med    Allergies Patient has no known allergies.  Family History  Problem Relation Age of Onset  . Hyperlipidemia Father   . Colon cancer Neg Hx   . Colon polyps Neg Hx   . Esophageal cancer Neg Hx   . Rectal cancer Neg Hx   . Stomach cancer Neg Hx     Social History Social History   Tobacco Use  . Smoking status: Never Smoker  . Smokeless tobacco: Never Used  Substance Use Topics  . Alcohol use: No  . Drug use: No    Review of Systems  All other systems negative except as documented in the HPI. All pertinent positives and negatives as reviewed in the  HPI. ____________________________________________   PHYSICAL EXAM:  VITAL SIGNS: ED Triage Vitals  Enc Vitals Group     BP 02/12/20 2055 (!) 147/100     Pulse Rate 02/12/20 2055 88     Resp 02/12/20 2055 18     Temp 02/12/20 2055 99 F (37.2 C)     Temp Source 02/12/20 2055 Oral     SpO2 02/12/20 2055 99 %    Constitutional: Alert and oriented. Well appearing and in no acute distress. Eyes: Conjunctivae are normal. PERRL. EOMI. Head: Atraumatic. Nose: swollen turbinates, frontal sinus ttp Mouth/Throat: Mucous membranes are moist.  Oropharynx non-erythematous. Neck: No stridor.  No meningeal signs.   Cardiovascular: Normal rate, regular rhythm. Good peripheral circulation. Grossly normal heart sounds.   Respiratory: Normal respiratory effort.  No retractions. Lungs CTAB. Gastrointestinal: Soft and nontender. No distention.  Musculoskeletal: No lower extremity tenderness nor edema. No gross deformities of extremities. Neurologic:  Normal speech and language. No gross focal neurologic deficits are appreciated.  Skin:  Skin is warm, dry and intact. No rash noted.   ____________________________________________   INITIAL IMPRESSION / ASSESSMENT AND PLAN / ED COURSE  Very well could is be allergic or viral in nature but with 3 weeks, changing nasal drip with bloody yellowish color to it we will treat for uncomplicated bacterial sinusitis as well.  Otherwise symptomatic treatment.  Pertinent labs & imaging results that were available during my care of the patient were reviewed by me and considered in my medical decision making (see chart for details).  A medical screening exam was performed and I feel the patient has had an appropriate workup for their chief complaint at this time and likelihood of emergent condition existing is low. They have been counseled on decision, discharge, follow up and which symptoms necessitate immediate return to the emergency department. They or their  family verbally stated understanding and agreement with plan and discharged in stable condition.   ____________________________________________  FINAL CLINICAL IMPRESSION(S) / ED DIAGNOSES  Final diagnoses:  Upper respiratory tract infection, unspecified type     MEDICATIONS GIVEN DURING THIS VISIT:  Medications  fluticasone (FLONASE) 50 MCG/ACT nasal spray 2 spray (2 sprays Each Nare Given 02/13/20 0107)  amoxicillin-clavulanate (AUGMENTIN) 875-125 MG per tablet 1 tablet (1 tablet Oral Given 02/13/20 0106)  loratadine (CLARITIN) tablet 10 mg (10 mg Oral Given 02/13/20 0106)     NEW OUTPATIENT MEDICATIONS STARTED DURING THIS VISIT:  Discharge Medication List as of 02/13/2020 12:47 AM    START taking these medications   Details  cetirizine-pseudoephedrine (ZYRTEC-D) 5-120 MG tablet Take 1 tablet by mouth daily., Starting Sat 02/13/2020, Normal        Note:  This note was prepared with assistance of Dragon voice recognition software. Occasional wrong-word or sound-a-like substitutions may have occurred due to the inherent limitations of voice recognition software.   Danayah Smyre, Corene Cornea, MD 02/13/20 [phone]

## 2020-04-01 ENCOUNTER — Emergency Department (HOSPITAL_COMMUNITY)
Admission: EM | Admit: 2020-04-01 | Discharge: 2020-04-02 | Disposition: A | Discharge status: Home / Self Care | Payer: Self-pay | Attending: Emergency Medicine | Admitting: Emergency Medicine

## 2020-04-01 ENCOUNTER — Encounter (HOSPITAL_COMMUNITY): Payer: Self-pay | Admitting: Emergency Medicine

## 2020-04-01 ENCOUNTER — Other Ambulatory Visit: Payer: Self-pay

## 2020-04-01 DIAGNOSIS — R8290 Unspecified abnormal findings in urine: Secondary | ICD-10-CM | POA: Insufficient documentation

## 2020-04-01 DIAGNOSIS — J0101 Acute recurrent maxillary sinusitis: Secondary | ICD-10-CM | POA: Insufficient documentation

## 2020-04-01 DIAGNOSIS — Z7982 Long term (current) use of aspirin: Secondary | ICD-10-CM | POA: Insufficient documentation

## 2020-04-01 DIAGNOSIS — Z79899 Other long term (current) drug therapy: Secondary | ICD-10-CM | POA: Insufficient documentation

## 2020-04-01 MED ORDER — DEXAMETHASONE SODIUM PHOSPHATE 10 MG/ML IJ SOLN
10.0000 mg | Freq: Once | INTRAMUSCULAR | Status: AC
  Administered 2020-04-02: 10 mg via INTRAMUSCULAR
  Filled 2020-04-01: qty 1

## 2020-04-01 MED ORDER — IBUPROFEN 800 MG PO TABS
800.0000 mg | ORAL_TABLET | Freq: Once | ORAL | Status: AC
  Administered 2020-04-02: 800 mg via ORAL
  Filled 2020-04-01: qty 1

## 2020-04-01 NOTE — ED Provider Notes (Signed)
TIME SEEN: 11:31 PM  CHIEF COMPLAINT: Sinus pressure  HPI: Patient is a 59 year old male with history of seasonal allergies who presents to the emergency department with 3 to 4 weeks of sinus pressure, ear pain, headache, brown drainage.  No fevers.  No chest pain, shortness of breath.  Does have intermittent cough but nothing significant.  Saw PCP early April.  Was in ED 02/13/20 for the same.  Finished last course of antibiotics in March.  When he saw his PCP, he was given a IM injection of steroids but was not put on antibiotics.  He reports he is on Zyrtec-D, famotidine, Flonase regularly without improvement.  He is requesting a course of antibiotics.  He states symptoms get worse with changes in the season, pollen.  Also reports concerns for tingling with urination and foul odor.  He is concerned he could have a UTI.  No dysuria, hematuria, penile discharge, abdominal pain, vomiting or diarrhea.  He is not concerned for STD as he states he is not currently sexually active and was recently tested for STDs by his PCP during a routine physical.  He states all of that was normal.   ROS: See HPI Constitutional: no fever  Eyes: no drainage  ENT: + Sinus pressure, sinus congestion Cardiovascular:  no chest pain  Resp: no SOB  GI: no vomiting GU: no dysuria Integumentary: no rash  Allergy: no hives  Musculoskeletal: no leg swelling  Neurological: no slurred speech ROS otherwise negative  PAST MEDICAL HISTORY/PAST SURGICAL HISTORY:  Past Medical History:  Diagnosis Date  . Allergy   . Shingles   . Sinus congestion     MEDICATIONS:  Prior to Admission medications   Medication Sig Start Date End Date Taking? Authorizing Provider  amoxicillin-clavulanate (AUGMENTIN) 875-125 MG tablet Take 1 tablet by mouth 2 (two) times daily. One po bid x 7 days 02/13/20   Mesner, Barbara Cower, MD  aspirin EC 81 MG tablet Take 81 mg by mouth at bedtime as needed for moderate pain.    [provider]   cetirizine-pseudoephedrine (ZYRTEC-D) 5-120 MG tablet Take 1 tablet by mouth daily. 02/13/20   Mesner, Barbara Cower, MD  COD LIVER OIL PO Take by mouth daily.    [provider]  fluticasone (FLONASE) 50 MCG/ACT nasal spray Place 1 spray into both nostrils daily. 08/31/19   Fayrene Helper, PA-C  Multiple Vitamin (MULTIVITAMIN) tablet Take 1 tablet by mouth daily.    [provider]  naproxen sodium (ALEVE) 220 MG tablet Take 220 mg by mouth daily as needed (pain).    [provider]  Omega-3 Fatty Acids (FISH OIL) 1000 MG CAPS Take by mouth.    [provider]  albuterol (PROVENTIL HFA;VENTOLIN HFA) 108 (90 Base) MCG/ACT inhaler Inhale 1-2 puffs into the lungs every 6 (six) hours as needed for wheezing or shortness of breath (or cough/congestion/etc). Patient not taking: Reported on 07/09/2018 05/27/17 08/31/19  Street, [place], PA-C  cetirizine (ZYRTEC ALLERGY) 10 MG tablet Take 1 tablet (10 mg total) by mouth daily for 7 days. 11/17/19 02/13/20  Gailen Shelter, PA    ALLERGIES:  No Known Allergies  SOCIAL HISTORY:  Social History   Tobacco Use  . Smoking status: Never Smoker  . Smokeless tobacco: Never Used  Substance Use Topics  . Alcohol use: No    FAMILY HISTORY: Family History  Problem Relation Age of Onset  . Hyperlipidemia Father   . Colon cancer Neg Hx   . Colon polyps Neg Hx   .  Esophageal cancer Neg Hx   . Rectal cancer Neg Hx   . Stomach cancer Neg Hx     EXAM: BP (!) 139/99 (BP Location: Left Arm)   Pulse 91   Temp 98.6 F (37 C) (Oral)   Resp 16   Ht 5\' 7"  (1.702 m)   Wt 84.8 kg   SpO2 100%   BMI 29.29 kg/m  CONSTITUTIONAL: Alert and oriented and responds appropriately to questions. Well-appearing; well-nourished HEAD: Normocephalic EYES: Conjunctivae clear, pupils appear equal, EOM appear intact ENT: normal nose; moist mucous membranes, tender over the frontal and maxillary sinuses without redness, warmth, soft tissue swelling.   No pharyngeal erythema or petechiae, no tonsillar hypertrophy or exudate, no uvular deviation, no unilateral swelling, no trismus or drooling, no muffled voice, normal phonation, no stridor, no dental caries present, no drainable dental abscess noted, no Ludwig's angina, tongue sits flat in the bottom of the mouth, no angioedema, no facial erythema or warmth, no facial swelling; no pain with movement of the neck, no cervical LAD.  TMs are clear bilaterally without erythema, purulence, bulging, perforation, effusion.  No cerumen impaction or sign of foreign body in the external auditory canal. No inflammation, erythema or drainage from the external auditory canal. No signs of mastoiditis. No pain with manipulation of the pinna bilaterally.  NECK: Supple, normal ROM CARD: RRR; S1 and S2 appreciated; no murmurs, no clicks, no rubs, no gallops RESP: Normal chest excursion without splinting or tachypnea; breath sounds clear and equal bilaterally; no wheezes, no rhonchi, no rales, no hypoxia or respiratory distress, speaking full sentences ABD/GI: Normal bowel sounds; non-distended; soft, non-tender, no rebound, no guarding, no peritoneal signs, no hepatosplenomegaly BACK:  The back appears normal EXT: Normal ROM in all joints; no deformity noted, no edema; no cyanosis SKIN: Normal color for age and race; warm; no rash on exposed skin NEURO: Moves all extremities equally PSYCH: The patient's mood and manner are appropriate.   MEDICAL DECISION MAKING: Patient here with sinusitis.  Suspect this is allergic in nature but he states that it has been going on for over 4 weeks and that he normally requires a course of antibiotics for improvement.  He has not followed up with a specialist.  Also complaining of urinary symptoms but declines STD screening today.  Will obtain urinalysis, urine culture.  Will give ibuprofen, IM Decadron today for symptomatic relief.  Likely discharge home with course of antibiotics and  outpatient ENT follow-up.  ED PROGRESS: Urinalysis reviewed/interpreted and shows no sign of infection today.  Will discharge with Augmentin and outpatient follow-up.  At this time, I do not feel there is any life-threatening condition present. I have reviewed, interpreted and discussed all results (EKG, imaging, lab, urine as appropriate) and exam findings with patient/family. I have reviewed nursing notes and appropriate previous records.  I feel the patient is safe to be discharged home without further emergent workup and can continue workup as an outpatient as needed. Discussed usual and customary return precautions. Patient/family verbalize understanding and are comfortable with this plan.  Outpatient follow-up has been provided as needed. All questions have been answered.    Ricardo [name] was evaluated in Emergency Department on 04/01/2020 for the symptoms described in the history of present illness. He was evaluated in the context of the global COVID-19 pandemic, which necessitated consideration that the patient might be at risk for infection with the SARS-CoV-2 virus that causes COVID-19. Institutional protocols and algorithms that pertain to the evaluation of  patients at risk for COVID-19 are in a state of rapid change based on information released by regulatory bodies including the CDC and federal and state organizations. These policies and algorithms were followed during the patient's care in the ED.      Rithwik Schmieg, Layla Maw, DO 04/02/20 [phone]

## 2020-04-01 NOTE — ED Triage Notes (Signed)
Patient is complaining of a sinus infection that started two weeks ago

## 2020-04-02 LAB — URINALYSIS, ROUTINE W REFLEX MICROSCOPIC
Bilirubin Urine: NEGATIVE
Glucose, UA: NEGATIVE mg/dL
Hgb urine dipstick: NEGATIVE
Ketones, ur: NEGATIVE mg/dL
Leukocytes,Ua: NEGATIVE
Nitrite: NEGATIVE
Protein, ur: NEGATIVE mg/dL
Specific Gravity, Urine: 1.02 (ref 1.005–1.030)
pH: 7 (ref 5.0–8.0)

## 2020-04-02 MED ORDER — AMOXICILLIN-POT CLAVULANATE 875-125 MG PO TABS: 1.0000 | ORAL_TABLET | Freq: Two times a day (BID) | ORAL | 0 refills | Status: DC

## 2020-04-02 NOTE — Discharge Instructions (Addendum)
Please continue your Zyrtec, Flonase, famotidine as prescribed.  You may alternate Tylenol 1000 mg every 6 hours as needed for pain, fever and Ibuprofen 800 mg every 8 hours as needed for pain, fever.  Please take Ibuprofen with food.  Do not take more than 4000 mg of Tylenol (acetaminophen) in a 24 hour period.  I also recommend close follow-up with an ear nose and throat physician given you have had multiple recurrent episodes of sinusitis.

## 2020-04-03 LAB — URINE CULTURE: Culture: NO GROWTH

## 2020-07-11 ENCOUNTER — Emergency Department (HOSPITAL_COMMUNITY)
Admission: EM | Admit: 2020-07-11 | Discharge: 2020-07-12 | Disposition: A | Discharge status: Home / Self Care | Payer: Self-pay | Attending: Emergency Medicine | Admitting: Emergency Medicine

## 2020-07-11 DIAGNOSIS — J0111 Acute recurrent frontal sinusitis: Secondary | ICD-10-CM

## 2020-07-11 DIAGNOSIS — Z20822 Contact with and (suspected) exposure to covid-19: Secondary | ICD-10-CM | POA: Insufficient documentation

## 2020-07-11 DIAGNOSIS — J0110 Acute frontal sinusitis, unspecified: Secondary | ICD-10-CM | POA: Insufficient documentation

## 2020-07-12 ENCOUNTER — Other Ambulatory Visit: Payer: Self-pay

## 2020-07-12 LAB — SARS CORONAVIRUS 2 BY RT PCR (HOSPITAL ORDER, PERFORMED IN ~~LOC~~ HOSPITAL LAB): SARS Coronavirus 2: NEGATIVE

## 2020-07-12 MED ORDER — IBUPROFEN 600 MG PO TABS: 600.0000 mg | ORAL_TABLET | Freq: Four times a day (QID) | ORAL | 0 refills | Status: DC | PRN

## 2020-07-12 MED ORDER — IBUPROFEN 200 MG PO TABS
600.0000 mg | ORAL_TABLET | Freq: Once | ORAL | Status: AC
  Administered 2020-07-12: 600 mg via ORAL
  Filled 2020-07-12: qty 3

## 2020-07-12 MED ORDER — AMOXICILLIN 500 MG PO CAPS: 1000.0000 mg | ORAL_CAPSULE | Freq: Two times a day (BID) | ORAL | 0 refills | Status: DC

## 2020-07-12 MED ORDER — AMOXICILLIN 500 MG PO CAPS
1000.0000 mg | ORAL_CAPSULE | Freq: Once | ORAL | Status: AC
  Administered 2020-07-12: 1000 mg via ORAL
  Filled 2020-07-12: qty 2

## 2020-07-12 MED ORDER — ALBUTEROL SULFATE HFA 108 (90 BASE) MCG/ACT IN AERS: 1.0000 | INHALATION_SPRAY | Freq: Four times a day (QID) | RESPIRATORY_TRACT | 0 refills | Status: DC | PRN

## 2020-07-12 NOTE — ED Provider Notes (Signed)
[facility]-EMERGENCY DEPT Provider Note   CSN: [id number] Arrival date & time: 07/11/20  2301     History No chief complaint on file.   Ricardo [name] is a 59 y.o. male.  Patient with history of HTN, allergies presents with c/o frontal pressure type headache, congestion, cough, ear pain for the past week. He is using his allergy medications including Flonase, Zyrtec-D and Tylenol with minimal relief. No fever. No nausea, vomiting, sore throat.   The history is provided by the patient. No language interpreter was used.       Past Medical History:  Diagnosis Date  . Allergy   . Shingles   . Sinus congestion     Patient Active Problem List   Diagnosis Date Noted  . Seasonal allergies 10/31/2011    Past Surgical History:  Procedure Laterality Date  . NO PAST SURGERIES         Family History  Problem Relation Age of Onset  . Hyperlipidemia Father   . Colon cancer Neg Hx   . Colon polyps Neg Hx   . Esophageal cancer Neg Hx   . Rectal cancer Neg Hx   . Stomach cancer Neg Hx     Social History   Tobacco Use  . Smoking status: Never Smoker  . Smokeless tobacco: Never Used  Vaping Use  . Vaping Use: Never used  Substance Use Topics  . Alcohol use: No  . Drug use: No    Home Medications Prior to Admission medications   Medication Sig Start Date End Date Taking? Authorizing Provider  amoxicillin-clavulanate (AUGMENTIN) 875-125 MG tablet Take 1 tablet by mouth every 12 (twelve) hours. 04/02/20   Ward, Layla Maw, DO  aspirin EC 81 MG tablet Take 81 mg by mouth at bedtime as needed for moderate pain.    [provider]  cetirizine-pseudoephedrine (ZYRTEC-D) 5-120 MG tablet Take 1 tablet by mouth daily. 02/13/20   Mesner, Barbara Cower, MD  COD LIVER OIL PO Take by mouth daily.    [provider]  fluticasone (FLONASE) 50 MCG/ACT nasal spray Place 1 spray into both nostrils daily. 08/31/19   Fayrene Helper, PA-C  Multiple Vitamin  (MULTIVITAMIN) tablet Take 1 tablet by mouth daily.    [provider]  naproxen sodium (ALEVE) 220 MG tablet Take 220 mg by mouth daily as needed (pain).    [provider]  Omega-3 Fatty Acids (FISH OIL) 1000 MG CAPS Take by mouth.    [provider]  albuterol (PROVENTIL HFA;VENTOLIN HFA) 108 (90 Base) MCG/ACT inhaler Inhale 1-2 puffs into the lungs every 6 (six) hours as needed for wheezing or shortness of breath (or cough/congestion/etc). Patient not taking: Reported on 07/09/2018 05/27/17 08/31/19  Street, [place], PA-C  cetirizine (ZYRTEC ALLERGY) 10 MG tablet Take 1 tablet (10 mg total) by mouth daily for 7 days. 11/17/19 02/13/20  Gailen Shelter, PA    Allergies    Patient has no known allergies.  Review of Systems   Review of Systems  Constitutional: Negative for chills and fever.  HENT: Positive for congestion, ear pain, sinus pressure and sinus pain. Negative for sore throat.   Respiratory: Positive for cough. Negative for shortness of breath.   Cardiovascular: Negative.  Negative for chest pain.  Gastrointestinal: Negative.  Negative for abdominal pain, diarrhea and vomiting.  Musculoskeletal: Negative.  Negative for myalgias.  Skin: Negative.   Neurological: Positive for headaches.    Physical Exam Updated Vital Signs BP (!) 140/110 (BP  Location: Right Arm)   Pulse 81   Temp (!) 97.4 F (36.3 C) (Oral)   Resp 18   Ht 5\' 7"  (1.702 m)   Wt 84.8 kg   SpO2 100%   BMI 29.29 kg/m   Physical Exam Vitals and nursing note reviewed.  Constitutional:      Appearance: He is well-developed.  HENT:     Head: Normocephalic and atraumatic.     Right Ear: Tympanic membrane normal.     Left Ear: Tympanic membrane normal.     Nose:     Right Turbinates: Swollen and pale.     Left Turbinates: Swollen and pale.     Right Sinus: Frontal sinus tenderness present.     Left Sinus: Frontal sinus tenderness present.     Mouth/Throat:     Mouth: Mucous  membranes are moist.     Pharynx: Oropharynx is clear. Uvula midline.     Tonsils: No tonsillar exudate.  Cardiovascular:     Rate and Rhythm: Normal rate and regular rhythm.     Heart sounds: No murmur heard.   Pulmonary:     Effort: Pulmonary effort is normal.     Breath sounds: Normal breath sounds. No wheezing, rhonchi or rales.  Abdominal:     General: Bowel sounds are normal.     Palpations: Abdomen is soft.     Tenderness: There is no abdominal tenderness. There is no guarding or rebound.  Musculoskeletal:        General: Normal range of motion.     Cervical back: Normal range of motion and neck supple.  Skin:    General: Skin is warm and dry.     Findings: No rash.  Neurological:     Mental Status: He is alert and oriented to person, place, and time.     ED Results / Procedures / Treatments   Labs (all labs ordered are listed, but only abnormal results are displayed) Labs Reviewed - No data to display  EKG None  Radiology No results found.  Procedures Procedures (including critical care time)  Medications Ordered in ED Medications - No data to display  ED Course  I have reviewed the triage vital signs and the nursing notes.  Pertinent labs & imaging results that were available during my care of the patient were reviewed by me and considered in my medical decision making (see chart for details).    MDM Rules/Calculators/A&P                          Patient to ED with recurrent symptoms sinusitis as described in the HPI.   He is overall well appearing. Elevated BP, however, reports not taking his medications today. No chest pain.   No fever. Symptoms x 1 week. Not COVID vaccinated. Will obtain swab for testing. However, will start on abx for recurrent sinusitis. Recommend Ibuprofen for headache pain.  Final Clinical Impression(s) / ED Diagnoses Final diagnoses:  None   1. Acute frontal sinusitis   Rx / DC Orders ED Discharge Orders    None        07/12/20 0125    09/11/20, MD 07/13/20 [phone]

## 2020-07-12 NOTE — ED Triage Notes (Signed)
Pt complaining of issues with his allergies.

## 2020-07-12 NOTE — Discharge Instructions (Signed)
Continue your regular medications for control of allergies. Take new medications as prescribed.   Your blood pressure today is 140/111. Continue your blood pressure medications as directed.   You can add plain saline nasal spray for additional relief, use a humidifier if available, over-the-counter cough medications if needed.   Follow up with your doctor in 3-4 days if no better.

## 2020-09-17 ENCOUNTER — Other Ambulatory Visit: Payer: Self-pay

## 2020-09-17 ENCOUNTER — Encounter (HOSPITAL_COMMUNITY): Payer: Self-pay | Admitting: Emergency Medicine

## 2020-09-17 DIAGNOSIS — J0191 Acute recurrent sinusitis, unspecified: Secondary | ICD-10-CM | POA: Insufficient documentation

## 2020-09-17 DIAGNOSIS — M5431 Sciatica, right side: Secondary | ICD-10-CM | POA: Insufficient documentation

## 2020-09-17 NOTE — ED Triage Notes (Signed)
Pt reports pain in his R leg that shoots from his R hip down to his mid calf. States that it has been going on for about 3 weeks. States that rest usually makes the pain better. Also reports facial pain around his sinuses. He reports a hx of sinusitis. States that they feel really full and his ears are popping. He also reports sneezing more frequently over the last few days.

## 2020-09-18 ENCOUNTER — Emergency Department (HOSPITAL_COMMUNITY)
Admission: EM | Admit: 2020-09-18 | Discharge: 2020-09-18 | Disposition: A | Discharge status: Home / Self Care | Payer: Self-pay | Attending: Emergency Medicine | Admitting: Emergency Medicine

## 2020-09-18 DIAGNOSIS — J0191 Acute recurrent sinusitis, unspecified: Secondary | ICD-10-CM

## 2020-09-18 DIAGNOSIS — M5431 Sciatica, right side: Secondary | ICD-10-CM

## 2020-09-18 MED ORDER — KETOROLAC TROMETHAMINE 30 MG/ML IJ SOLN
30.0000 mg | Freq: Once | INTRAMUSCULAR | Status: AC
  Administered 2020-09-18: 30 mg via INTRAMUSCULAR
  Filled 2020-09-18: qty 1

## 2020-09-18 MED ORDER — METHYLPREDNISOLONE 4 MG PO TBPK: ORAL_TABLET | ORAL | 0 refills | Status: DC

## 2020-09-18 MED ORDER — METHOCARBAMOL 500 MG PO TABS
500.0000 mg | ORAL_TABLET | Freq: Once | ORAL | Status: AC
  Administered 2020-09-18: 500 mg via ORAL
  Filled 2020-09-18: qty 1

## 2020-09-18 MED ORDER — LIDOCAINE 5 % EX PTCH
1.0000 | MEDICATED_PATCH | Freq: Once | CUTANEOUS | Status: DC
  Administered 2020-09-18: 1 via TRANSDERMAL
  Filled 2020-09-18: qty 1

## 2020-09-18 MED ORDER — METHOCARBAMOL 500 MG PO TABS: 500.0000 mg | ORAL_TABLET | Freq: Two times a day (BID) | ORAL | 0 refills | Status: DC

## 2020-09-18 MED ORDER — AMOXICILLIN-POT CLAVULANATE 875-125 MG PO TABS: 1.0000 | ORAL_TABLET | Freq: Two times a day (BID) | ORAL | 0 refills | Status: DC

## 2020-09-18 MED ORDER — AMOXICILLIN-POT CLAVULANATE 875-125 MG PO TABS
1.0000 | ORAL_TABLET | Freq: Once | ORAL | Status: AC
  Administered 2020-09-18: 1 via ORAL
  Filled 2020-09-18: qty 1

## 2020-09-18 MED ORDER — CETIRIZINE-PSEUDOEPHEDRINE ER 5-120 MG PO TB12: 1.0000 | ORAL_TABLET | Freq: Every day | ORAL | 0 refills | Status: DC

## 2020-09-18 NOTE — Discharge Instructions (Signed)
Your evaluated today for recurrent sinusitis as well as left hip and leg pain that seems most consistent with sciatica Please take antibiotics as directed for treatment of your sinusitis continue using Flonase as well as prescribed Zyrtec-D to help with nasal congestion, avoid using Afrin as this can worsen your congestion.  You can also use over-the-counter saline nasal rinses to help reduce congestion.  Sciatica can take time to treat, use exercises provided and the medications detailed below, and if symptoms are still not improving it is very important that you follow-up with your primary care doctor or sports medicine for further evaluation.  HOME INSTRUCTIONS Self - care:  The application of heat can help soothe the pain.  Maintaining your daily activities, including walking (this is encouraged), as it will help you get better faster than just staying in bed. Do not life, push, pull anything more than 10 pounds for the next week. I am attaching back exercises that you can do at home to help facilitate your recovery.   Back Exercises - I have attached a handout on back exercises that can be done at home to help facilitate your recovery.   Medications are also useful to help with pain control.   Acetaminophen.  This medication is generally safe, and found over the counter. Take as directed for your age. You should not take more than 8 of the extra strength (500mg ) pills a day (max dose is 4000mg  total OVER one day)  Non steroidal anti inflammatory: This includes medications including Ibuprofen, naproxen and Mobic; These medications help both pain and swelling and are very useful in treating back pain.  They should be taken with food, as they can cause stomach upset, and more seriously, stomach bleeding. Do not combine the medications.  Lidocaine Patch: Salon Pas lidocaine patches (blue and silver box) can be purchased over the counter and worn for 12 hours for local pain relief   Muscle  relaxants:  These medications can help with muscle tightness that is a cause of lower back pain.  Most of these medications can cause drowsiness, and it is not safe to drive or use dangerous machinery while taking them. They are primarily helpful when taken at night before sleep.  Prednisone - This is an oral steroid.  This medication is best taken with food in the morning.  Please note that this medication can cause anxiety, mood swings, muscle fatigue, increased hunger, weight gain (sodium/fluid retention), poor sleep as well as other symptoms. If you are a diabetic, please monitor your blood sugars at home as this medication can increase your blood sugars. Call your pharmacist if you have any questions.  You will need to follow up with your primary healthcare provider or the Orthopedist in 1-2 weeks for reassessment and persistent symptoms.  Be aware that if you develop new symptoms, such as a fever, leg weakness, difficulty with or loss of control of your urine or bowels, abdominal pain, or more severe pain, you will need to seek medical attention and/or return to the Emergency department. Additional Information:  Your vital signs today were: BP (!) 164/111 (BP Location: Right Arm)   Pulse 88   Temp 98.1 F (36.7 C) (Oral)   Resp 18   Ht 5\' 7"  (1.702 m)   Wt 83.9 kg   SpO2 100%   BMI 28.98 kg/m  If your blood pressure (BP) was elevated above 135/85 this visit, please have this repeated by your doctor within one month. ---------------

## 2020-09-18 NOTE — ED Provider Notes (Signed)
[facility]-EMERGENCY DEPT Provider Note   CSN: [id number] Arrival date & time: 09/17/20  2323     History Chief Complaint  Patient presents with  . Leg Pain  . Facial Pain    Ricardo [name] is a 59 y.o. male.  Ricardo [name] is a 59 y.o. male with a history of recurrent sinusitis, shingles, seasonal allergies, who presents to the emergency department for evaluation of pain shooting from the right hip down his right leg. He states this pain has been occurring for about 3 weeks. He denies any inciting injury or trauma, denies any heavy lifting or activity out of the ordinary. He states that rest usually makes this better and that certain movements seem to bring the pain on and send it down his leg. He denies any associated numbness or weakness. He reports no back pain but the pain seems to start in the buttock and back of his head. He denies any saddle anesthesia or loss of bowel or bladder control. No associated urinary symptoms, fevers or chills and no abdominal pain. Patient also complains of nasal congestion, facial pain and sinus pressure. Patient states that he has a history of frequent sinus infections and this 1 has been going on for about 2 weeks now. He has been using over-the-counter medications like Flonase and another nasal spray, was previously taking Zyrtec-D but ran out of this, and states that his congestion continues to worsen with frequent facial pain and headaches. He reports sinus congestion causes some ear popping but no persistent ear pain. He reports purulent nasal drainage, and occasional sore throat due to postnasal drip. No frequent cough, chest pain or shortness of breath. This feels exactly like episodes of sinusitis he has had previously, denies any sick contacts. Was last treated with antibiotics for his sinusitis at the beginning of August. He has seen his PCP multiple times but has not seen a specialist regarding these recurrent sinus  infections. No other aggravating or alleviating factors.        Past Medical History:  Diagnosis Date  . Allergy   . Shingles   . Sinus congestion     Patient Active Problem List   Diagnosis Date Noted  . Seasonal allergies 10/31/2011    Past Surgical History:  Procedure Laterality Date  . NO PAST SURGERIES         Family History  Problem Relation Age of Onset  . Hyperlipidemia Father   . Colon cancer Neg Hx   . Colon polyps Neg Hx   . Esophageal cancer Neg Hx   . Rectal cancer Neg Hx   . Stomach cancer Neg Hx     Social History   Tobacco Use  . Smoking status: Never Smoker  . Smokeless tobacco: Never Used  Vaping Use  . Vaping Use: Never used  Substance Use Topics  . Alcohol use: No  . Drug use: No    Home Medications Prior to Admission medications   Medication Sig Start Date End Date Taking? Authorizing Provider  albuterol (VENTOLIN HFA) 108 (90 Base) MCG/ACT inhaler Inhale 1-2 puffs into the lungs every 6 (six) hours as needed (cough). 07/12/20   Elpidio Anis, PA-C  amoxicillin (AMOXIL) 500 MG capsule Take 2 capsules (1,000 mg total) by mouth 2 (two) times daily. 07/12/20   Elpidio Anis, PA-C  amoxicillin-clavulanate (AUGMENTIN) 875-125 MG tablet Take 1 tablet by mouth every 12 (twelve) hours. 04/02/20   Ward, Layla Maw, DO  aspirin EC 81 MG  tablet Take 81 mg by mouth at bedtime as needed for moderate pain.    [provider]  cetirizine-pseudoephedrine (ZYRTEC-D) 5-120 MG tablet Take 1 tablet by mouth daily. 02/13/20   Mesner, Barbara Cower, MD  COD LIVER OIL PO Take by mouth daily.    [provider]  fluticasone (FLONASE) 50 MCG/ACT nasal spray Place 1 spray into both nostrils daily. 08/31/19   Fayrene Helper, PA-C  ibuprofen (ADVIL) 600 MG tablet Take 1 tablet (600 mg total) by mouth every 6 (six) hours as needed. 07/12/20   Elpidio Anis, PA-C  Multiple Vitamin (MULTIVITAMIN) tablet Take 1 tablet by mouth daily.    [provider]    naproxen sodium (ALEVE) 220 MG tablet Take 220 mg by mouth daily as needed (pain).    [provider]  Omega-3 Fatty Acids (FISH OIL) 1000 MG CAPS Take by mouth.    [provider]  cetirizine (ZYRTEC ALLERGY) 10 MG tablet Take 1 tablet (10 mg total) by mouth daily for 7 days. 11/17/19 02/13/20  Gailen Shelter, PA    Allergies    Patient has no known allergies.  Review of Systems   Review of Systems  Constitutional: Negative for chills and fever.  HENT: Positive for congestion, rhinorrhea, sinus pressure, sinus pain and sore throat. Negative for ear discharge, ear pain and trouble swallowing.   Eyes: Negative for visual disturbance.  Respiratory: Negative for cough and shortness of breath.   Cardiovascular: Negative for chest pain and leg swelling.  Gastrointestinal: Negative for abdominal pain, nausea and vomiting.  Genitourinary: Negative for dysuria and frequency.  Musculoskeletal: Positive for arthralgias and myalgias. Negative for back pain.  Skin: Negative for color change and rash.  Neurological: Negative for weakness and numbness.    Physical Exam Updated Vital Signs BP (!) 164/111 (BP Location: Right Arm)   Pulse 88   Temp 98.1 F (36.7 C) (Oral)   Resp 18   Ht 5\' 7"  (1.702 m)   Wt 83.9 kg   SpO2 100%   BMI 28.98 kg/m   Physical Exam Vitals and nursing note reviewed.  Constitutional:      General: He is not in acute distress.    Appearance: Normal appearance. He is well-developed. He is not ill-appearing or diaphoretic.     Comments: Well-appearing and in no distress  HENT:     Head: Atraumatic.     Right Ear: Tympanic membrane and ear canal normal.     Left Ear: Tympanic membrane and ear canal normal.     Nose: Congestion and rhinorrhea present.     Comments: Significant congestion and erythematous and edematous nasal mucosa noted with clear rhinorrhea present, facial tenderness primarily over the maxillary and frontal sinuses     Mouth/Throat:     Mouth: Mucous membranes are moist.     Pharynx: Oropharynx is clear. No oropharyngeal exudate or posterior oropharyngeal erythema.  Eyes:     General:        Right eye: No discharge.        Left eye: No discharge.  Cardiovascular:     Rate and Rhythm: Normal rate and regular rhythm.     Pulses:          Radial pulses are 2+ on the right side and 2+ on the left side.       Dorsalis pedis pulses are 2+ on the right side and 2+ on the left side.       Posterior tibial pulses are  2+ on the right side and 2+ on the left side.     Heart sounds: Normal heart sounds.  Pulmonary:     Effort: Pulmonary effort is normal. No respiratory distress.     Breath sounds: Normal breath sounds.     Comments: Respirations equal and unlabored, patient able to speak in full sentences, lungs clear to auscultation bilaterally Abdominal:     General: Bowel sounds are normal. There is no distension.     Palpations: Abdomen is soft. There is no mass.     Tenderness: There is no abdominal tenderness. There is no guarding.     Comments: Abdomen soft, nondistended, nontender to palpation in all quadrants without guarding or peritoneal signs, no CVA tenderness bilaterally  Musculoskeletal:     Cervical back: Neck supple.     Comments: Tenderness to palpation over right buttock and posterior hip.  Pain made worse with range of motion of the lower extremities, positive straight leg raise on the right. Distal pulses 2+, no focal calf tenderness or swelling noted, normal sensation and strength  Skin:    General: Skin is warm and dry.     Capillary Refill: Capillary refill takes less than 2 seconds.  Neurological:     Mental Status: He is alert and oriented to person, place, and time.     Comments: Alert, clear speech, following commands. Moving all extremities without difficulty. Bilateral lower extremities with 5/5 strength in proximal and distal muscle groups and with dorsi and plantar  flexion. Sensation intact in bilateral lower extremities. Ambulatory with steady gait  Psychiatric:        Mood and Affect: Mood normal.        Behavior: Behavior normal.     ED Results / Procedures / Treatments   Labs (all labs ordered are listed, but only abnormal results are displayed) Labs Reviewed - No data to display  EKG None  Radiology No results found.  Procedures Procedures (including critical care time)  Medications Ordered in ED Medications - No data to display  ED Course  I have reviewed the triage vital signs and the nursing notes.  Pertinent labs & imaging results that were available during my care of the patient were reviewed by me and considered in my medical decision making (see chart for details).    MDM Rules/Calculators/A&P                          Symptoms consistent with sciatica with pain starting in the right buttock and radiating down the leg, consistently reproducible. Normal neurological exam, no evidence of urinary incontinence or retention, pain is consistently reproducible. There is no evidence of AAA or concern for dissection at this time.   Patient can walk but states is painful.  No loss of bowel or bladder control.  No concern for cauda equina.  No fever, night sweats, weight loss, h/o cancer, IVDU.  Pain treated here in the department with adequate improvement. RICE protocol and NSAIDs, muscle relaxers and short course of steroids indicated and discussed with patient. I have also discussed reasons to return immediately to the ER.  Patient expresses understanding and agrees with plan.  Patient complaining of symptoms of sinusitis.   Severe symptoms have been present for greater than 10 days with purulent nasal discharge and maxillary sinus pain.  Concern for acute bacterial rhinosinusitis.  Patient discharged with Augmentin.  Instructions given for warm saline nasal wash and additional measure for symtomatic  treatment and recommendations for  follow-up with primary care physician or ENT given recurrent infections.    .    Final Clinical Impression(s) / ED Diagnoses Final diagnoses:  Sciatica of right side  Acute recurrent sinusitis, unspecified location    Rx / DC Orders ED Discharge Orders         Ordered    amoxicillin-clavulanate (AUGMENTIN) 875-125 MG tablet  2 times daily        09/18/20 0724    cetirizine-pseudoephedrine (ZYRTEC-D) 5-120 MG tablet  Daily        09/18/20 0724    methocarbamol (ROBAXIN) 500 MG tablet  2 times daily        09/18/20 0724    methylPREDNISolone (MEDROL DOSEPAK) 4 MG TBPK tablet        09/18/20 0724           Dartha Lodge, PA-C 09/18/20 4270    Paula Libra, MD 09/20/20 1249

## 2020-11-04 ENCOUNTER — Emergency Department (HOSPITAL_COMMUNITY)
Admission: EM | Admit: 2020-11-04 | Discharge: 2020-11-05 | Disposition: A | Discharge status: Home / Self Care | Payer: Self-pay | Attending: Emergency Medicine | Admitting: Emergency Medicine

## 2020-11-04 ENCOUNTER — Other Ambulatory Visit: Payer: Self-pay

## 2020-11-04 ENCOUNTER — Encounter (HOSPITAL_COMMUNITY): Payer: Self-pay | Admitting: Emergency Medicine

## 2020-11-04 DIAGNOSIS — J302 Other seasonal allergic rhinitis: Secondary | ICD-10-CM

## 2020-11-04 DIAGNOSIS — M79604 Pain in right leg: Secondary | ICD-10-CM | POA: Insufficient documentation

## 2020-11-04 DIAGNOSIS — J301 Allergic rhinitis due to pollen: Secondary | ICD-10-CM | POA: Insufficient documentation

## 2020-11-04 DIAGNOSIS — I10 Essential (primary) hypertension: Secondary | ICD-10-CM | POA: Insufficient documentation

## 2020-11-04 NOTE — ED Triage Notes (Addendum)
Pt c/o allergies symptoms x 3 weeks - head/nose congestion, ears throbbing, productive cough, mild body aches, and itchy eyes. Also c/o an intermittent "throbbing" sensation in his right lateral calf muscle. Pt denies injury to area. Pt has full movement in RLE.

## 2020-11-05 MED ORDER — FLUTICASONE PROPIONATE 50 MCG/ACT NA SUSP
1.0000 | Freq: Every day | NASAL | Status: DC
  Administered 2020-11-05: 1 via NASAL
  Filled 2020-11-05: qty 16

## 2020-11-05 MED ORDER — LOSARTAN POTASSIUM 50 MG PO TABS: 50.0000 mg | ORAL_TABLET | Freq: Every day | ORAL | 0 refills | Status: DC

## 2020-11-05 MED ORDER — FLUTICASONE PROPIONATE 50 MCG/ACT NA SUSP
1.0000 | Freq: Every day | NASAL | Status: DC
  Filled 2020-11-05: qty 16

## 2020-11-05 NOTE — Discharge Instructions (Addendum)
Recommend Zyrtec-D daily, and Flonase nasal inhaler for treatment of seasonal allergies.   Ibuprofen and/or Tylenol for right leg pain. Losartan has been electronically sent to your pharmacy for 30 days. Please follow up with your doctor for further refills and management of hypertension.   Follow up with your doctor for recheck and further management of symptoms if they persist.

## 2020-11-05 NOTE — ED Provider Notes (Signed)
[facility]-EMERGENCY DEPT Provider Note   CSN: [id number] Arrival date & time: 11/04/20  1954     History Chief Complaint  Patient presents with  . Leg Pain  . Allergies    Ricardo [name] is a 59 y.o. male.  Patient with history of HTN, allergies, presents with recurrent symptoms of nasal congestion, postnasal drainage causing cough and mild sore throat, sneezing, watery eyes. He reports symptoms are more prevalent at the start of the fall season. No fever. No nausea or vomiting. No chest tightness or pain. He also reports 3 week history of soreness to right lower leg without injury. No swelling, redness or discoloration.   The history is provided by the patient. No language interpreter was used.  Leg Pain Associated symptoms: no fever and no neck pain        Past Medical History:  Diagnosis Date  . Allergy   . Shingles   . Sinus congestion     Patient Active Problem List   Diagnosis Date Noted  . Seasonal allergies 10/31/2011    Past Surgical History:  Procedure Laterality Date  . NO PAST SURGERIES         Family History  Problem Relation Age of Onset  . Hyperlipidemia Father   . Colon cancer Neg Hx   . Colon polyps Neg Hx   . Esophageal cancer Neg Hx   . Rectal cancer Neg Hx   . Stomach cancer Neg Hx     Social History   Tobacco Use  . Smoking status: Never Smoker  . Smokeless tobacco: Never Used  Vaping Use  . Vaping Use: Never used  Substance Use Topics  . Alcohol use: No  . Drug use: No    Home Medications Prior to Admission medications   Medication Sig Start Date End Date Taking? Authorizing Provider  albuterol (VENTOLIN HFA) 108 (90 Base) MCG/ACT inhaler Inhale 1-2 puffs into the lungs every 6 (six) hours as needed (cough). 07/12/20   Elpidio Anis, PA-C  amoxicillin (AMOXIL) 500 MG capsule Take 2 capsules (1,000 mg total) by mouth 2 (two) times daily. 07/12/20   Elpidio Anis, PA-C  amoxicillin-clavulanate  (AUGMENTIN) 875-125 MG tablet Take 1 tablet by mouth 2 (two) times daily. One po bid x 7 days 09/18/20   Dartha Lodge, PA-C  aspirin EC 81 MG tablet Take 81 mg by mouth at bedtime as needed for moderate pain.    [provider]  cetirizine-pseudoephedrine (ZYRTEC-D) 5-120 MG tablet Take 1 tablet by mouth daily. 09/18/20   Dartha Lodge, PA-C  COD LIVER OIL PO Take by mouth daily.    [provider]  fluticasone (FLONASE) 50 MCG/ACT nasal spray Place 1 spray into both nostrils daily. 08/31/19   Fayrene Helper, PA-C  ibuprofen (ADVIL) 600 MG tablet Take 1 tablet (600 mg total) by mouth every 6 (six) hours as needed. 07/12/20   Elpidio Anis, PA-C  methocarbamol (ROBAXIN) 500 MG tablet Take 1 tablet (500 mg total) by mouth 2 (two) times daily. 09/18/20   Dartha Lodge, PA-C  methylPREDNISolone (MEDROL DOSEPAK) 4 MG TBPK tablet Take as directed 09/18/20   Dartha Lodge, PA-C  Multiple Vitamin (MULTIVITAMIN) tablet Take 1 tablet by mouth daily.    [provider]  naproxen sodium (ALEVE) 220 MG tablet Take 220 mg by mouth daily as needed (pain).    [provider]  Omega-3 Fatty Acids (FISH OIL) 1000 MG CAPS Take by mouth.  [provider]  cetirizine (ZYRTEC ALLERGY) 10 MG tablet Take 1 tablet (10 mg total) by mouth daily for 7 days. 11/17/19 02/13/20  Gailen Shelter, PA    Allergies    Patient has no known allergies.  Review of Systems   Review of Systems  Constitutional: Negative for activity change, appetite change, chills and fever.  HENT: Positive for congestion, postnasal drip, rhinorrhea, sinus pressure, sneezing and sore throat.   Eyes: Positive for itching.  Respiratory: Positive for cough. Negative for chest tightness, shortness of breath and wheezing.   Cardiovascular: Negative.  Negative for chest pain.  Gastrointestinal: Negative.  Negative for nausea.  Musculoskeletal: Negative for myalgias and neck pain.       See HPI.  Skin:  Negative.  Negative for rash.  Neurological: Negative.     Physical Exam Updated Vital Signs BP (!) 154/114 (BP Location: Right Arm)   Pulse 88   Temp 98.5 F (36.9 C) (Oral)   Resp 18   Ht 5\' 7"  (1.702 m)   Wt 83.9 kg   SpO2 100%   BMI 28.98 kg/m   Physical Exam Vitals and nursing note reviewed.  Constitutional:      General: He is not in acute distress.    Appearance: Normal appearance. He is well-developed. He is not ill-appearing.  HENT:     Head: Normocephalic.     Nose: Congestion present.     Mouth/Throat:     Mouth: Mucous membranes are moist.     Pharynx: Oropharynx is clear. No oropharyngeal exudate.  Eyes:     Conjunctiva/sclera: Conjunctivae normal.  Cardiovascular:     Rate and Rhythm: Normal rate and regular rhythm.     Pulses: Normal pulses.     Heart sounds: No murmur heard.   Pulmonary:     Effort: Pulmonary effort is normal.     Breath sounds: Normal breath sounds. No wheezing, rhonchi or rales.  Abdominal:     General: Bowel sounds are normal.     Palpations: Abdomen is soft.     Tenderness: There is no abdominal tenderness. There is no guarding or rebound.  Musculoskeletal:        General: Normal range of motion.     Cervical back: Normal range of motion and neck supple.     Comments: Right lower leg without swelling, discoloration. Tender focally to midshaft, anterolateral surface. No induration. No calf tenderness.   Lymphadenopathy:     Cervical: No cervical adenopathy.  Skin:    General: Skin is warm and dry.     Findings: No rash.  Neurological:     Mental Status: He is alert and oriented to person, place, and time.     ED Results / Procedures / Treatments   Labs (all labs ordered are listed, but only abnormal results are displayed) Labs Reviewed - No data to display  EKG None  Radiology No results found.  Procedures Procedures (including critical care time)  Medications Ordered in ED Medications - No data to  display  ED Course  I have reviewed the triage vital signs and the nursing notes.  Pertinent labs & imaging results that were available during my care of the patient were reviewed by me and considered in my medical decision making (see chart for details).    MDM Rules/Calculators/A&P                          Patient to ED with  ss/sxs of recurrent seasonal allergies. He had been on Zyrtec previously but is not taking anything currently. No fever. Exam reassuring. No bacterial infection suspected. Recommend Zyrtec-D, Flonase.  Blood pressure is elevated. He has been out of his Losartan for the past 2 months. Will provide Rx.   Do not suspect DVT of right lower leg. No evidence infection or bony injury. Distal pulses intact. Recommend supportive care.   Final Clinical Impression(s) / ED Diagnoses Final diagnoses:  None   1. Seasonal allergies 2. HTN 3. Right leg pain  Rx / DC Orders ED Discharge Orders    None       Danne Harbor 11/05/20 Evelene Croon, MD 11/05/20 [phone]

## 2020-12-26 ENCOUNTER — Other Ambulatory Visit: Payer: Self-pay

## 2020-12-26 ENCOUNTER — Encounter (HOSPITAL_COMMUNITY): Payer: Self-pay | Admitting: Obstetrics and Gynecology

## 2020-12-26 DIAGNOSIS — J0100 Acute maxillary sinusitis, unspecified: Secondary | ICD-10-CM | POA: Insufficient documentation

## 2020-12-26 DIAGNOSIS — Z7982 Long term (current) use of aspirin: Secondary | ICD-10-CM | POA: Insufficient documentation

## 2020-12-26 NOTE — ED Triage Notes (Signed)
Patient reports his eyes are burning and his ears are popping. Patient reports he has had this before and it was sinus issues.

## 2020-12-27 ENCOUNTER — Emergency Department (HOSPITAL_COMMUNITY)
Admission: EM | Admit: 2020-12-27 | Discharge: 2020-12-27 | Disposition: A | Discharge status: Home / Self Care | Payer: Self-pay | Attending: Emergency Medicine | Admitting: Emergency Medicine

## 2020-12-27 DIAGNOSIS — J0100 Acute maxillary sinusitis, unspecified: Secondary | ICD-10-CM

## 2020-12-27 MED ORDER — AMOXICILLIN-POT CLAVULANATE 875-125 MG PO TABS: 1.0000 | ORAL_TABLET | Freq: Two times a day (BID) | ORAL | 0 refills | Status: DC

## 2020-12-27 MED ORDER — FLUTICASONE FUROATE 27.5 MCG/SPRAY NA SUSP: 2.0000 | Freq: Every day | NASAL | 12 refills | Status: DC

## 2020-12-27 MED ORDER — AMOXICILLIN-POT CLAVULANATE 875-125 MG PO TABS
1.0000 | ORAL_TABLET | Freq: Once | ORAL | Status: AC
  Administered 2020-12-27: 1 via ORAL
  Filled 2020-12-27: qty 1

## 2020-12-27 NOTE — Discharge Instructions (Addendum)
Take the medications as prescribed.  Return for new worsening symptoms.  Follow up with primary care provider for evaluation of your blood pressure.

## 2020-12-27 NOTE — ED Provider Notes (Signed)
[facility]-EMERGENCY DEPT Provider Note   CSN: [id number] Arrival date & time: 12/26/20  1707    History Chief Complaint  Patient presents with  . Sinus Problem    Ricardo [name] is a 60 y.o. male with past medical history significant for seasonal allergies, recurrent sinusitis who presents for evaluation of nasal congestion. Patient states 3 weeks ago he started with congestion and rhinorrhea. Initially clear in nature however is now dark yellow. He has pressure to his frontal and maxillary sinuses. Does state occasionally he has itching, watery eyes however he has been taking Zyrtec which has helped some. Been using Flonase which has helped in the beginning however is no longer helping. No Covid exposures. No sudden onset" headache, sore throat, chest pain, shortness of breath, neck stiffness, neck rigidity, nausea, vomiting, diarrhea, weakness. Denies additional aggravating or relieving factors.  History obtained from patient and past medical records. No interpreter used.  HPI     Past Medical History:  Diagnosis Date  . Allergy   . Shingles   . Sinus congestion     Patient Active Problem List   Diagnosis Date Noted  . Seasonal allergies 10/31/2011    Past Surgical History:  Procedure Laterality Date  . NO PAST SURGERIES         Family History  Problem Relation Age of Onset  . Hyperlipidemia Father   . Colon cancer Neg Hx   . Colon polyps Neg Hx   . Esophageal cancer Neg Hx   . Rectal cancer Neg Hx   . Stomach cancer Neg Hx     Social History   Tobacco Use  . Smoking status: Never Smoker  . Smokeless tobacco: Never Used  Vaping Use  . Vaping Use: Never used  Substance Use Topics  . Alcohol use: No  . Drug use: No    Home Medications Prior to Admission medications   Medication Sig Start Date End Date Taking? Authorizing Provider  amoxicillin-clavulanate (AUGMENTIN) 875-125 MG tablet Take 1 tablet by mouth every 12 (twelve)  hours. 12/27/20  Yes Nakeita Styles A, PA-C  fluticasone (VERAMYST) 27.5 MCG/SPRAY nasal spray Place 2 sprays into the nose daily. 12/27/20  Yes  Antolin A, PA-C  albuterol (VENTOLIN HFA) 108 (90 Base) MCG/ACT inhaler Inhale 1-2 puffs into the lungs every 6 (six) hours as needed (cough). 07/12/20   Elpidio Anis, PA-C  amoxicillin (AMOXIL) 500 MG capsule Take 2 capsules (1,000 mg total) by mouth 2 (two) times daily. 07/12/20   Elpidio Anis, PA-C  aspirin EC 81 MG tablet Take 81 mg by mouth at bedtime as needed for moderate pain.    [provider]  cetirizine-pseudoephedrine (ZYRTEC-D) 5-120 MG tablet Take 1 tablet by mouth daily. 09/18/20   Dartha Lodge, PA-C  COD LIVER OIL PO Take by mouth daily.    [provider]  fluticasone (FLONASE) 50 MCG/ACT nasal spray Place 1 spray into both nostrils daily. 08/31/19   Fayrene Helper, PA-C  ibuprofen (ADVIL) 600 MG tablet Take 1 tablet (600 mg total) by mouth every 6 (six) hours as needed. 07/12/20   Elpidio Anis, PA-C  losartan (COZAAR) 50 MG tablet Take 1 tablet (50 mg total) by mouth daily. 11/05/20   Elpidio Anis, PA-C  methocarbamol (ROBAXIN) 500 MG tablet Take 1 tablet (500 mg total) by mouth 2 (two) times daily. 09/18/20   Dartha Lodge, PA-C  methylPREDNISolone (MEDROL DOSEPAK) 4 MG TBPK tablet Take as directed 09/18/20   Jodi Geralds  N, PA-C  Multiple Vitamin (MULTIVITAMIN) tablet Take 1 tablet by mouth daily.    [provider]  naproxen sodium (ALEVE) 220 MG tablet Take 220 mg by mouth daily as needed (pain).    [provider]  Omega-3 Fatty Acids (FISH OIL) 1000 MG CAPS Take by mouth.    [provider]  cetirizine (ZYRTEC ALLERGY) 10 MG tablet Take 1 tablet (10 mg total) by mouth daily for 7 days. 11/17/19 02/13/20  Gailen Shelter, PA    Allergies    Patient has no known allergies.  Review of Systems   Review of Systems  Constitutional: Negative.   HENT: Positive for congestion,  postnasal drip, rhinorrhea, sinus pressure and sinus pain. Negative for ear discharge, ear pain, facial swelling, hearing loss, mouth sores, nosebleeds, sore throat, tinnitus, trouble swallowing and voice change.   Respiratory: Negative.   Cardiovascular: Negative.   Gastrointestinal: Negative.   Genitourinary: Negative.   Musculoskeletal: Negative.   Skin: Negative.   Neurological: Negative.   All other systems reviewed and are negative.   Physical Exam Updated Vital Signs BP (!) 163/116 (BP Location: Left Arm)   Pulse 60   Temp 99.4 F (37.4 C) (Oral)   Resp 18   Wt 87.5 kg   SpO2 99%   BMI 30.21 kg/m   Physical Exam Vitals and nursing note reviewed.  Constitutional:      General: He is not in acute distress.    Appearance: He is well-developed and well-nourished. He is not ill-appearing, toxic-appearing or diaphoretic.  HENT:     Head: Normocephalic and atraumatic.     Nose: Congestion and rhinorrhea present.     Comments: Boggy nasal turbinates, purulent, yellow rhinorrhea to bilateral nares. Tenderness to frontal maxillary sinuses bilaterally.    Mouth/Throat:     Mouth: Mucous membranes are moist.     Comments: Posterior oropharynx clear. Mucous membranes moist. Tongue midline. Eyes:     Pupils: Pupils are equal, round, and reactive to light.  Neck:     Comments: No neck stiffness or neck rigidity. No meningismus. Cardiovascular:     Rate and Rhythm: Normal rate and regular rhythm.     Pulses: Normal pulses.     Heart sounds: Normal heart sounds.  Pulmonary:     Effort: Pulmonary effort is normal. No respiratory distress.     Breath sounds: Normal breath sounds.     Comments: Clear to auscultation bilateral without wheeze, rhonchi or rales. Abdominal:     General: Bowel sounds are normal. There is no distension.     Palpations: Abdomen is soft.     Comments: Soft, nontender without rebound or guarding  Musculoskeletal:        General: Normal range of motion.      Cervical back: Normal range of motion and neck supple.  Skin:    General: Skin is warm and dry.     Capillary Refill: Capillary refill takes less than 2 seconds.  Neurological:     General: No focal deficit present.     Mental Status: He is alert and oriented to person, place, and time.  Psychiatric:        Mood and Affect: Mood and affect normal.     ED Results / Procedures / Treatments   Labs (all labs ordered are listed, but only abnormal results are displayed) Labs Reviewed - No data to display  EKG None  Radiology No results found.  Procedures Procedures   Medications Ordered in  ED Medications  amoxicillin-clavulanate (AUGMENTIN) 875-125 MG per tablet 1 tablet (has no administration in time range)    ED Course  I have reviewed the triage vital signs and the nursing notes.  Pertinent labs & imaging results that were available during my care of the patient were reviewed by me and considered in my medical decision making (see chart for details).  60 year old presents for evaluation of congestion, purulent rhinorrhea. History of recurrent maxillary sinusitis. He has tenderness to his frontal and maxillary sinuses. Purulent rhinorrhea to bilateral nares with boggy turbinates. No known Covid exposures. Symptoms started 3 weeks ago. Does not want Covid testing which I feel is reasonable given timing of his timing symptoms.  Clinically patient has sinusitis. Will treat clinically. He will return for new or worsening symptoms  The patient has been appropriately medically screened and/or stabilized in the ED. I have low suspicion for any other emergent medical condition which would require further screening, evaluation or treatment in the ED or require inpatient management.  Patient is hemodynamically stable and in no acute distress.  Patient able to ambulate in department prior to ED.  Evaluation does not show acute pathology that would require ongoing or additional emergent  interventions while in the emergency department or further inpatient treatment.  I have discussed the diagnosis with the patient and answered all questions.  Pain is been managed while in the emergency department and patient has no further complaints prior to discharge.  Patient is comfortable with plan discussed in room and is stable for discharge at this time.  I have discussed strict return precautions for returning to the emergency department.  Patient was encouraged to follow-up with PCP/specialist refer to at discharge.    MDM Rules/Calculators/A&P                         CHIRAG KRUEGER was evaluated in Emergency Department on 12/27/2020 for the symptoms described in the history of present illness. He was evaluated in the context of the global COVID-19 pandemic, which necessitated consideration that the patient might be at risk for infection with the SARS-CoV-2 virus that causes COVID-19. Institutional protocols and algorithms that pertain to the evaluation of patients at risk for COVID-19 are in a state of rapid change based on information released by regulatory bodies including the CDC and federal and state organizations. These policies and algorithms were followed during the patient's care in the ED. Final Clinical Impression(s) / ED Diagnoses Final diagnoses:  Acute non-recurrent maxillary sinusitis    Rx / DC Orders ED Discharge Orders         Ordered    amoxicillin-clavulanate (AUGMENTIN) 875-125 MG tablet  Every 12 hours        12/27/20 0820    fluticasone (VERAMYST) 27.5 MCG/SPRAY nasal spray  Daily        12/27/20 0820           Nike Southers A, PA-C 12/27/20 0821    Virgina Norfolk, DO 12/27/20 [phone]

## 2021-04-19 ENCOUNTER — Emergency Department (HOSPITAL_COMMUNITY)
Admission: EM | Admit: 2021-04-19 | Discharge: 2021-04-19 | Disposition: A | Discharge status: Home / Self Care | Payer: Self-pay | Attending: Emergency Medicine | Admitting: Emergency Medicine

## 2021-04-19 ENCOUNTER — Encounter (HOSPITAL_COMMUNITY): Payer: Self-pay

## 2021-04-19 ENCOUNTER — Other Ambulatory Visit: Payer: Self-pay

## 2021-04-19 DIAGNOSIS — J302 Other seasonal allergic rhinitis: Secondary | ICD-10-CM | POA: Insufficient documentation

## 2021-04-19 DIAGNOSIS — J019 Acute sinusitis, unspecified: Secondary | ICD-10-CM | POA: Insufficient documentation

## 2021-04-19 DIAGNOSIS — J3089 Other allergic rhinitis: Secondary | ICD-10-CM

## 2021-04-19 DIAGNOSIS — J329 Chronic sinusitis, unspecified: Secondary | ICD-10-CM

## 2021-04-19 MED ORDER — METHYLPREDNISOLONE 4 MG PO TBPK: ORAL_TABLET | ORAL | 0 refills | Status: DC

## 2021-04-19 MED ORDER — AZITHROMYCIN 250 MG PO TABS
500.0000 mg | ORAL_TABLET | Freq: Once | ORAL | Status: AC
  Administered 2021-04-19: 500 mg via ORAL
  Filled 2021-04-19: qty 2

## 2021-04-19 MED ORDER — AZITHROMYCIN 250 MG PO TABS: ORAL_TABLET | ORAL | 0 refills | Status: DC

## 2021-04-19 NOTE — Discharge Instructions (Addendum)
1 ) FLonase- spray 1 tablet into each nostril daily 2) Zadiator (Ketotifen) eye drops- 1 drop in each eye daily 3) switch from Zyrtec-D to regular Zyrtec or Xyzal when your symptoms have improved.

## 2021-04-19 NOTE — ED Provider Notes (Signed)
[facility]-EMERGENCY DEPT Provider Note   CSN: [id number] Arrival date & time: 04/19/21  1821     History Chief Complaint  Patient presents with  . Seasonal Allergies    Ricardo [name] is a 60 y.o. male who presents emergency department with chief complaint of sinus pain and pressure.  Patient states that he has had ongoing pain, pressure, ear pain and fullness, daily headaches for the past 2 months.  He has a history of seasonal allergies and has been taking Zyrtec-D for the past week which has improved his symptoms along with naproxen sodium.  He states that he feels like he may have an infection given the pain in his sinuses.  He has some nasal drainage posteriorly but denies any fevers, chills, nausea, vomiting.  HPI     Past Medical History:  Diagnosis Date  . Allergy   . Shingles   . Sinus congestion     Patient Active Problem List   Diagnosis Date Noted  . Seasonal allergies 10/31/2011    Past Surgical History:  Procedure Laterality Date  . NO PAST SURGERIES         Family History  Problem Relation Age of Onset  . Hyperlipidemia Father   . Colon cancer Neg Hx   . Colon polyps Neg Hx   . Esophageal cancer Neg Hx   . Rectal cancer Neg Hx   . Stomach cancer Neg Hx     Social History   Tobacco Use  . Smoking status: Never Smoker  . Smokeless tobacco: Never Used  Vaping Use  . Vaping Use: Never used  Substance Use Topics  . Alcohol use: No  . Drug use: No    Home Medications Prior to Admission medications   Medication Sig Start Date End Date Taking? Authorizing Provider  albuterol (VENTOLIN HFA) 108 (90 Base) MCG/ACT inhaler Inhale 1-2 puffs into the lungs every 6 (six) hours as needed (cough). 07/12/20   Elpidio Anis, PA-C  amoxicillin (AMOXIL) 500 MG capsule Take 2 capsules (1,000 mg total) by mouth 2 (two) times daily. 07/12/20   Elpidio Anis, PA-C  amoxicillin-clavulanate (AUGMENTIN) 875-125 MG tablet Take 1 tablet by  mouth every 12 (twelve) hours. 12/27/20   Henderly, Britni A, PA-C  aspirin EC 81 MG tablet Take 81 mg by mouth at bedtime as needed for moderate pain.    [provider]  cetirizine-pseudoephedrine (ZYRTEC-D) 5-120 MG tablet Take 1 tablet by mouth daily. 09/18/20   Dartha Lodge, PA-C  COD LIVER OIL PO Take by mouth daily.    [provider]  fluticasone (FLONASE) 50 MCG/ACT nasal spray Place 1 spray into both nostrils daily. 08/31/19   Fayrene Helper, PA-C  fluticasone (VERAMYST) 27.5 MCG/SPRAY nasal spray Place 2 sprays into the nose daily. 12/27/20   Henderly, Britni A, PA-C  ibuprofen (ADVIL) 600 MG tablet Take 1 tablet (600 mg total) by mouth every 6 (six) hours as needed. 07/12/20   Elpidio Anis, PA-C  losartan (COZAAR) 50 MG tablet Take 1 tablet (50 mg total) by mouth daily. 11/05/20   Elpidio Anis, PA-C  methocarbamol (ROBAXIN) 500 MG tablet Take 1 tablet (500 mg total) by mouth 2 (two) times daily. 09/18/20   Dartha Lodge, PA-C  methylPREDNISolone (MEDROL DOSEPAK) 4 MG TBPK tablet Take as directed 09/18/20   Dartha Lodge, PA-C  Multiple Vitamin (MULTIVITAMIN) tablet Take 1 tablet by mouth daily.    [provider]  naproxen sodium (ALEVE) 220 MG tablet  Take 220 mg by mouth daily as needed (pain).    [provider]  Omega-3 Fatty Acids (FISH OIL) 1000 MG CAPS Take by mouth.    [provider]  cetirizine (ZYRTEC ALLERGY) 10 MG tablet Take 1 tablet (10 mg total) by mouth daily for 7 days. 11/17/19 02/13/20  Gailen Shelter, PA    Allergies    Patient has no known allergies.  Review of Systems   Review of Systems Ten systems reviewed and are negative for acute change, except as noted in the HPI.   Physical Exam Updated Vital Signs BP (!) 132/98 (BP Location: Left Arm)   Pulse 87   Temp 97.6 F (36.4 C) (Oral)   Resp 18   Ht 5\' 8"  (1.727 m)   Wt 83.9 kg   SpO2 100%   BMI 28.13 kg/m   Physical Exam Vitals and nursing note  reviewed.  Constitutional:      General: He is not in acute distress.    Appearance: He is well-developed. He is not diaphoretic.  HENT:     Head: Normocephalic and atraumatic.     Right Ear: Tympanic membrane normal.     Left Ear: Tympanic membrane normal.     Nose: Congestion present.     Right Sinus: Maxillary sinus tenderness and frontal sinus tenderness present.     Left Sinus: Maxillary sinus tenderness and frontal sinus tenderness present.  Eyes:     General: No scleral icterus.    Conjunctiva/sclera: Conjunctivae normal.  Cardiovascular:     Rate and Rhythm: Normal rate and regular rhythm.     Heart sounds: Normal heart sounds.  Pulmonary:     Effort: Pulmonary effort is normal. No respiratory distress.     Breath sounds: Normal breath sounds.  Abdominal:     Palpations: Abdomen is soft.     Tenderness: There is no abdominal tenderness.  Musculoskeletal:     Cervical back: Normal range of motion and neck supple.  Skin:    General: Skin is warm and dry.  Neurological:     Mental Status: He is alert.  Psychiatric:        Behavior: Behavior normal.     ED Results / Procedures / Treatments   Labs (all labs ordered are listed, but only abnormal results are displayed) Labs Reviewed - No data to display  EKG None  Radiology No results found.  Procedures Procedures   Medications Ordered in ED Medications - No data to display  ED Course  I have reviewed the triage vital signs and the nursing notes.  Pertinent labs & imaging results that were available during my care of the patient were reviewed by me and considered in my medical decision making (see chart for details).    MDM Rules/Calculators/A&P                          Patient with acute on chronic sinusitis.  We will treat for infection with azithromycin and Medrol Dosepak.  I have also written the patient for his keto type and eyedrops, Flonase and have advised the patient when he is improving he should  switch from Zyrtec-D to regular Zyrtec or Xyzal.  I had a discussion with the patient about treatment of seasonal allergies.  Patient appears otherwise appropriate for discharge at this time with outpatient follow-up. Final Clinical Impression(s) / ED Diagnoses Final diagnoses:  None    Rx / DC Orders ED Discharge  Orders    None       Arthor Captain, PA-C 04/19/21 1939    Bethann Berkshire, MD 04/21/21 1550

## 2021-04-19 NOTE — ED Triage Notes (Signed)
Pt c/o seasonal allergies. Pt c/o sinus pressure.

## 2021-10-11 ENCOUNTER — Encounter (HOSPITAL_COMMUNITY): Payer: Self-pay | Admitting: Oncology

## 2021-10-11 ENCOUNTER — Other Ambulatory Visit: Payer: Self-pay

## 2021-10-11 ENCOUNTER — Emergency Department (HOSPITAL_COMMUNITY)
Admission: EM | Admit: 2021-10-11 | Discharge: 2021-10-11 | Disposition: A | Discharge status: Home / Self Care | Payer: Self-pay | Attending: Emergency Medicine | Admitting: Emergency Medicine

## 2021-10-11 ENCOUNTER — Emergency Department (HOSPITAL_COMMUNITY): Payer: Self-pay

## 2021-10-11 DIAGNOSIS — J329 Chronic sinusitis, unspecified: Secondary | ICD-10-CM | POA: Insufficient documentation

## 2021-10-11 MED ORDER — AMOXICILLIN-POT CLAVULANATE 875-125 MG PO TABS: 1.0000 | ORAL_TABLET | Freq: Two times a day (BID) | ORAL | 0 refills | Status: DC

## 2021-10-11 MED ORDER — ACETAMINOPHEN 325 MG PO TABS
650.0000 mg | ORAL_TABLET | Freq: Once | ORAL | Status: AC
  Administered 2021-10-11: 650 mg via ORAL
  Filled 2021-10-11: qty 2

## 2021-10-11 MED ORDER — IBUPROFEN 200 MG PO TABS
600.0000 mg | ORAL_TABLET | Freq: Once | ORAL | Status: AC
  Administered 2021-10-11: 600 mg via ORAL
  Filled 2021-10-11: qty 3

## 2021-10-11 NOTE — ED Triage Notes (Signed)
Pt presents d/t sinus congestion/pressure, cough excessive mucus.  Sx have been present x 2 months.  Pt has been using OTC medications w/o relief.

## 2021-10-11 NOTE — ED Provider Notes (Signed)
Emergency Medicine Provider Triage Evaluation Note  Ricardo [name] , a 60 y.o. male  was evaluated in triage.  Pt complains of sinus congestion and cough for the past 2 and half months.  Been using Flonase and Nettie Potts without help.  No insurance and has not been able to follow with anybody else.  Review of Systems  Positive: Cough and congestion Negative: Chest pain or shortness of breath  Physical Exam  BP (!) 173/114 (BP Location: Left Arm)   Pulse 83   Temp 97.9 F (36.6 C) (Oral)   Resp 15   SpO2 99%  Gen:   Awake, no distress   Resp:  Normal effort  MSK:   Moves extremities without difficulty  Other:  Lung sounds clear  Medical Decision Making  Medically screening exam initiated at 6:28 PM.  Appropriate orders placed.  Ricardo [name] was informed that the remainder of the evaluation will be completed by another provider, this initial triage assessment does not replace that evaluation, and the importance of remaining in the ED until their evaluation is complete.    Woodroe Chen 10/11/21 1829    Cheryll Cockayne, MD 10/11/21 2059

## 2021-10-11 NOTE — Discharge Instructions (Signed)
Call your primary care doctor or specialist as discussed in the next 2-3 days.   Return immediately back to the ER if:  Your symptoms worsen within the next 12-24 hours. You develop new symptoms such as new fevers, persistent vomiting, new pain, shortness of breath, or new weakness or numbness, or if you have any other concerns.  

## 2021-10-11 NOTE — ED Provider Notes (Signed)
[facility]-EMERGENCY DEPT Provider Note   CSN: [id number] Arrival date & time: 10/11/21  1722     History Chief Complaint  Patient presents with   Sinus Problems    Ricardo [name] is a 60 y.o. male.  Patient presents with frontal sinus congestion nasal congestion increased mucus production, symptoms ongoing for 2 months.  He states that he had sinus problems before this in the past as well and he has seen ENT but he has not been able to get in for the last several weeks.  He has been trying various medications over-the-counter without improvement of symptoms and presents to the ER.  Denies any shortness of breath or chest pain denies any fevers or cough or sore throat.      Past Medical History:  Diagnosis Date   Allergy    Shingles    Sinus congestion     Patient Active Problem List   Diagnosis Date Noted   Seasonal allergies 10/31/2011    Past Surgical History:  Procedure Laterality Date   NO PAST SURGERIES         Family History  Problem Relation Age of Onset   Hyperlipidemia Father    Colon cancer Neg Hx    Colon polyps Neg Hx    Esophageal cancer Neg Hx    Rectal cancer Neg Hx    Stomach cancer Neg Hx     Social History   Tobacco Use   Smoking status: Never   Smokeless tobacco: Never  Vaping Use   Vaping Use: Never used  Substance Use Topics   Alcohol use: No   Drug use: No    Home Medications Prior to Admission medications   Medication Sig Start Date End Date Taking? Authorizing Provider  amoxicillin-clavulanate (AUGMENTIN) 875-125 MG tablet Take 1 tablet by mouth every 12 (twelve) hours. 10/11/21  Yes Cheryll Cockayne, MD  albuterol (VENTOLIN HFA) 108 (90 Base) MCG/ACT inhaler Inhale 1-2 puffs into the lungs every 6 (six) hours as needed (cough). 07/12/20   Elpidio Anis, PA-C  aspirin EC 81 MG tablet Take 81 mg by mouth at bedtime as needed for moderate pain.    [provider]  cetirizine-pseudoephedrine  (ZYRTEC-D) 5-120 MG tablet Take 1 tablet by mouth daily. 09/18/20   Dartha Lodge, PA-C  COD LIVER OIL PO Take by mouth daily.    [provider]  fluticasone (FLONASE) 50 MCG/ACT nasal spray Place 1 spray into both nostrils daily. 08/31/19   Fayrene Helper, PA-C  fluticasone (VERAMYST) 27.5 MCG/SPRAY nasal spray Place 2 sprays into the nose daily. 12/27/20   Henderly, Britni A, PA-C  ibuprofen (ADVIL) 600 MG tablet Take 1 tablet (600 mg total) by mouth every 6 (six) hours as needed. 07/12/20   Elpidio Anis, PA-C  losartan (COZAAR) 50 MG tablet Take 1 tablet (50 mg total) by mouth daily. 11/05/20   Elpidio Anis, PA-C  methocarbamol (ROBAXIN) 500 MG tablet Take 1 tablet (500 mg total) by mouth 2 (two) times daily. 09/18/20   Dartha Lodge, PA-C  methylPREDNISolone (MEDROL DOSEPAK) 4 MG TBPK tablet Use as directed 04/19/21   Arthor Captain, PA-C  Multiple Vitamin (MULTIVITAMIN) tablet Take 1 tablet by mouth daily.    [provider]  naproxen sodium (ALEVE) 220 MG tablet Take 220 mg by mouth daily as needed (pain).    [provider]  Omega-3 Fatty Acids (FISH OIL) 1000 MG CAPS Take by mouth.    [provider]  cetirizine (ZYRTEC ALLERGY) 10 MG tablet Take 1 tablet (10 mg total) by mouth daily for 7 days. 11/17/19 02/13/20  Gailen Shelter, PA    Allergies    Patient has no known allergies.  Review of Systems   Review of Systems  Constitutional:  Negative for fever.  HENT:  Negative for ear pain and sore throat.   Eyes:  Negative for pain.  Respiratory:  Negative for cough.   Cardiovascular:  Negative for chest pain.  Gastrointestinal:  Negative for abdominal pain.  Genitourinary:  Negative for flank pain.  Musculoskeletal:  Negative for back pain.  Skin:  Negative for color change and rash.  Neurological:  Negative for syncope.  All other systems reviewed and are negative.  Physical Exam Updated Vital Signs BP (!) 173/114 (BP Location: Left Arm)    Pulse 83   Temp 97.9 F (36.6 C) (Oral)   Resp 15   SpO2 99%   Physical Exam Constitutional:      Appearance: He is well-developed.  HENT:     Head: Normocephalic.     Nose: Nose normal.  Eyes:     Extraocular Movements: Extraocular movements intact.  Cardiovascular:     Rate and Rhythm: Normal rate.  Pulmonary:     Effort: Pulmonary effort is normal.  Musculoskeletal:     Comments: Positive supraorbital tenderness on percussion with finger bilaterally.  Skin:    Coloration: Skin is not jaundiced.  Neurological:     Mental Status: He is alert. Mental status is at baseline.    ED Results / Procedures / Treatments   Labs (all labs ordered are listed, but only abnormal results are displayed) Labs Reviewed - No data to display  EKG None  Radiology DG Chest 2 View  Result Date: 10/11/2021 CLINICAL DATA:  Congestion/cough. EXAM: CHEST - 2 VIEW COMPARISON:  08/31/2019. FINDINGS: The heart size and mediastinal contours are within normal limits. No consolidation, effusion, or pneumothorax. No acute osseous abnormality. IMPRESSION: No acute cardiopulmonary process. Electronically Signed   By: Thornell Sartorius M.D.   On: 10/11/2021 20:04    Procedures Procedures   Medications Ordered in ED Medications  acetaminophen (TYLENOL) tablet 650 mg (has no administration in time range)  ibuprofen (ADVIL) tablet 600 mg (has no administration in time range)    ED Course  I have reviewed the triage vital signs and the nursing notes.  Pertinent labs & imaging results that were available during my care of the patient were reviewed by me and considered in my medical decision making (see chart for details).    MDM Rules/Calculators/A&P                           Given duration of symptoms will treat with antibiotics.  Recommending outpatient follow-up with ENT within the week.  Advising immediate return for difficulty breathing fevers or any additional concerns.  Final Clinical  Impression(s) / ED Diagnoses Final diagnoses:  Chronic sinusitis, unspecified location    Rx / DC Orders ED Discharge Orders          Ordered    amoxicillin-clavulanate (AUGMENTIN) 875-125 MG tablet  Every 12 hours        10/11/21 2102             Cheryll Cockayne, MD 10/11/21 2102

## 2022-03-02 ENCOUNTER — Other Ambulatory Visit: Payer: Self-pay

## 2022-03-02 ENCOUNTER — Emergency Department (HOSPITAL_COMMUNITY)
Admission: EM | Admit: 2022-03-02 | Discharge: 2022-03-02 | Disposition: A | Discharge status: Home / Self Care | Payer: Managed Care, Other (non HMO) | Attending: Emergency Medicine | Admitting: Emergency Medicine

## 2022-03-02 ENCOUNTER — Encounter (HOSPITAL_COMMUNITY): Payer: Self-pay

## 2022-03-02 DIAGNOSIS — H7583 Other specified disorders of middle ear and mastoid in diseases classified elsewhere, bilateral: Secondary | ICD-10-CM | POA: Insufficient documentation

## 2022-03-02 DIAGNOSIS — J302 Other seasonal allergic rhinitis: Secondary | ICD-10-CM | POA: Diagnosis present

## 2022-03-02 DIAGNOSIS — Z760 Encounter for issue of repeat prescription: Secondary | ICD-10-CM

## 2022-03-02 DIAGNOSIS — Z7982 Long term (current) use of aspirin: Secondary | ICD-10-CM | POA: Diagnosis not present

## 2022-03-02 MED ORDER — IPRATROPIUM BROMIDE HFA 17 MCG/ACT IN AERS
2.0000 | INHALATION_SPRAY | Freq: Once | RESPIRATORY_TRACT | Status: AC
  Administered 2022-03-02: 2 via RESPIRATORY_TRACT
  Filled 2022-03-02: qty 12.9

## 2022-03-02 MED ORDER — FLUTICASONE PROPIONATE 50 MCG/ACT NA SUSP
1.0000 | Freq: Every day | NASAL | Status: DC
  Administered 2022-03-02: 1 via NASAL
  Filled 2022-03-02: qty 16

## 2022-03-02 MED ORDER — AMOXICILLIN-POT CLAVULANATE 875-125 MG PO TABS: 1.0000 | ORAL_TABLET | Freq: Two times a day (BID) | ORAL | 0 refills | Status: DC

## 2022-03-02 MED ORDER — LOSARTAN POTASSIUM 50 MG PO TABS: 50.0000 mg | ORAL_TABLET | Freq: Every day | ORAL | 0 refills | Status: DC

## 2022-03-02 NOTE — ED Triage Notes (Signed)
Patient said he has bad allergies. Aching, nose running, coughing, sneezing. He said the pollen is getting to him really bad this year and every year. He said he tried over the counter medications, did not work.  ?

## 2022-03-02 NOTE — Discharge Instructions (Signed)
Your symptoms are related to your allergies not being well under control. We have given you some Flonase and an inhaler for you here which you  have said helped your symptoms in the past. I also recommend you double checking that the Zyrtec you are using is Zyrtec -D which has an extra medication that helps with nasal congestion.  ? ?Since your symptoms have been going on over a month, if you are still having tenderness to your sinuses after one week, you can pick up the prescription for antibiotics that I sent to your pharmacy. ?

## 2022-03-02 NOTE — ED Provider Notes (Signed)
?[facility]-EMERGENCY DEPT ?Provider Note ? ? ?CSN: [id number] ?Arrival date & time: 03/02/22  1913 ? ?  ? ?History ? ?Chief Complaint  ?Patient presents with  ? Allergies  ? ? ?Ricardo [name] is a 61 y.o. male who presents to the ED for evaluation of seasonal allergies that are not improving at home with Zyrtec and saline sprays.  Per chart review, patient has been seen numerous times over the last couple of years when he has flareups of allergies or sinus infections.  He notes that he does not have insurance has not been without Flonase for quite a while which does help with his symptoms.  His current flareup has been ongoing for approximately 2 months and endorses frontal and maxillary congestion/tenderness, nasal congestion, and sneezing.  He has had a having a productive cough with clear phlegm and denies fevers, chills, chest pain and shortness of breath. ? ? ? ?HPI ? ?  ? ?Home Medications ?Prior to Admission medications   ?Medication Sig Start Date End Date Taking? Authorizing Provider  ?amoxicillin-clavulanate (AUGMENTIN) 875-125 MG tablet Take 1 tablet by mouth every 12 (twelve) hours. 03/02/22  Yes Raynald Blend R, PA-C  ?losartan (COZAAR) 50 MG tablet Take 1 tablet (50 mg total) by mouth daily. 03/02/22  Yes Raynald Blend R, PA-C  ?albuterol (VENTOLIN HFA) 108 (90 Base) MCG/ACT inhaler Inhale 1-2 puffs into the lungs every 6 (six) hours as needed (cough). 07/12/20   Elpidio Anis, PA-C  ?aspirin EC 81 MG tablet Take 81 mg by mouth at bedtime as needed for moderate pain.    [provider]  ?cetirizine-pseudoephedrine (ZYRTEC-D) 5-120 MG tablet Take 1 tablet by mouth daily. 09/18/20   Dartha Lodge, PA-C  ?COD LIVER OIL PO Take by mouth daily.    [provider]  ?fluticasone (FLONASE) 50 MCG/ACT nasal spray Place 1 spray into both nostrils daily. 08/31/19   Fayrene Helper, PA-C  ?fluticasone (VERAMYST) 27.5 MCG/SPRAY nasal spray Place 2 sprays into the nose daily.  12/27/20   Henderly, Britni A, PA-C  ?ibuprofen (ADVIL) 600 MG tablet Take 1 tablet (600 mg total) by mouth every 6 (six) hours as needed. 07/12/20   Elpidio Anis, PA-C  ?losartan (COZAAR) 50 MG tablet Take 1 tablet (50 mg total) by mouth daily. 11/05/20   Elpidio Anis, PA-C  ?methocarbamol (ROBAXIN) 500 MG tablet Take 1 tablet (500 mg total) by mouth 2 (two) times daily. 09/18/20   Dartha Lodge, PA-C  ?methylPREDNISolone (MEDROL DOSEPAK) 4 MG TBPK tablet Use as directed 04/19/21   Arthor Captain, PA-C  ?Multiple Vitamin (MULTIVITAMIN) tablet Take 1 tablet by mouth daily.    [provider]  ?naproxen sodium (ALEVE) 220 MG tablet Take 220 mg by mouth daily as needed (pain).    [provider]  ?Omega-3 Fatty Acids (FISH OIL) 1000 MG CAPS Take by mouth.    [provider]  ?cetirizine (ZYRTEC ALLERGY) 10 MG tablet Take 1 tablet (10 mg total) by mouth daily for 7 days. 11/17/19 02/13/20  Gailen Shelter, PA  ?   ? ?Allergies    ?Patient has no known allergies.   ? ?Review of Systems   ?Review of Systems ? ?Physical Exam ?Updated Vital Signs ?BP (!) 148/98 (BP Location: Left Arm)   Pulse 90   Temp 98.6 ?F (37 ?C) (Oral)   Resp 16   Ht 5\' 8"  (1.727 m)   Wt 83.9 kg   SpO2 100%   BMI 28.13  kg/m?  ?Physical Exam ?Vitals and nursing note reviewed.  ?Constitutional:   ?   General: He is not in acute distress. ?   Appearance: He is not ill-appearing.  ?HENT:  ?   Head: Atraumatic.  ?   Ears:  ?   Comments: Middle ear effusion without erythema or bulging bilaterally ?   Nose:  ?   Comments: Boggy enlarged, inflamed turbinates bilaterally ? ?Mild tenderness of the bilateral frontal and maxillary sinuses ?Eyes:  ?   Conjunctiva/sclera: Conjunctivae normal.  ?Cardiovascular:  ?   Rate and Rhythm: Normal rate and regular rhythm.  ?   Pulses: Normal pulses.  ?   Heart sounds: No murmur heard. ?Pulmonary:  ?   Effort: Pulmonary effort is normal. No respiratory distress.  ?   Breath sounds: Normal  breath sounds.  ?Abdominal:  ?   General: Abdomen is flat. There is no distension.  ?   Palpations: Abdomen is soft.  ?   Tenderness: There is no abdominal tenderness.  ?Musculoskeletal:     ?   General: Normal range of motion.  ?   Cervical back: Normal range of motion.  ?Skin: ?   General: Skin is warm and dry.  ?   Capillary Refill: Capillary refill takes less than 2 seconds.  ?Neurological:  ?   General: No focal deficit present.  ?   Mental Status: He is alert.  ?Psychiatric:     ?   Mood and Affect: Mood normal.  ? ? ?ED Results / Procedures / Treatments   ?Labs ?(all labs ordered are listed, but only abnormal results are displayed) ?Labs Reviewed - No data to display ? ?EKG ?None ? ?Radiology ?No results found. ? ?Procedures ?Procedures  ? ? ?Medications Ordered in ED ?Medications  ?fluticasone (FLONASE) 50 MCG/ACT nasal spray 1 spray (1 spray Each Nare Given 03/02/22 2028)  ?ipratropium (ATROVENT HFA) inhaler 2 puff (2 puffs Inhalation Given 03/02/22 2027)  ? ? ?ED Course/ Medical Decision Making/ A&P ?  ?                        ?Medical Decision Making ?Risk ?Prescription drug management. ? ? ?History:  ?Per HPI ?Social determinants of health: uninsured, without PCP, community health referral provided ? ?Initial impression: ? ?This patient presents to the ED for concern of seasonal allergies, this involves an extensive number of treatment options, and is a complaint that carries with it a high risk of complications and morbidity.    ? ? ?Medicines ordered and prescription drug management: ? ?I ordered medication including: ?Flonase ?Albuterol inhaler   ?Reevaluation of the patient after these medicines showed that the patient improved ?I have reviewed the patients home medicines and have made adjustments as needed ? ? ? ?ED Course: ?61 year old male with known history of sinusitis and seasonal allergies who presents to the ED for evaluation of uncontrolled seasonal allergies.  Physical exam without signs of  acute infection although he did have sinus tenderness and inflamed nasal turbinates.  Remainder exam otherwise benign.  Patient has been using Zyrtec for symptoms.  He states that he has had improvement asked with an inhaler and Flonase but since he is without insurance that he has been unable to pick either of these up.  Flonase inhaler ordered and given here in the ED for him to take home.  Since his symptoms been going on for about 1.5 months, I advised him to attempt conservative measures for  1 week for symptom relief, but we will send in a prescription for Augmentin that he can fill if his symptoms do not resolve.  Patient additionally asks for referral to clinic where he can get disability approved.  Also requests refill for losartan as he has been out. ? ?Disposition: ? ?After consideration of the diagnostic results, physical exam, history and the patients response to treatment feel that the patent would benefit from discharge.   ?Seasonal allergies: Recommend patient using Zyrtec-D versus Zyrtec.  Flonase and inhaler provided. Augmentin prescribed should patient fail conservative therapy. Discharged home in stable condition. ?Medication refill: Losartan refilled for 1 month supply.  Referral to [facility] provided so that patient can establish care for refills in the future. ? ? ?Final Clinical Impression(s) / ED Diagnoses ?Final diagnoses:  ?Seasonal allergies  ?Medication refill  ? ? ?Rx / DC Orders ?ED Discharge Orders   ? ?      Ordered  ?  amoxicillin-clavulanate (AUGMENTIN) 875-125 MG tablet  Every 12 hours       ? 03/02/22 1954  ?  losartan (COZAAR) 50 MG tablet  Daily       ? 03/02/22 2042  ? ?  ?  ? ?  ? ? ?  ?Janell Quiet, [place] ?03/02/22 2054 ? ?  ?Rozelle Logan, DO ?03/02/22 2214 ? ?

## 2022-05-14 ENCOUNTER — Encounter: Payer: Self-pay | Admitting: Student

## 2022-05-14 ENCOUNTER — Ambulatory Visit (INDEPENDENT_AMBULATORY_CARE_PROVIDER_SITE_OTHER): Payer: Commercial Managed Care - HMO | Admitting: Student

## 2022-05-14 VITALS — BP 157/104 | HR 95 | Ht 68.0 in | Wt 195.0 lb

## 2022-05-14 DIAGNOSIS — Z0000 Encounter for general adult medical examination without abnormal findings: Secondary | ICD-10-CM

## 2022-05-14 DIAGNOSIS — I10 Essential (primary) hypertension: Secondary | ICD-10-CM | POA: Diagnosis not present

## 2022-05-14 DIAGNOSIS — Z1211 Encounter for screening for malignant neoplasm of colon: Secondary | ICD-10-CM | POA: Diagnosis not present

## 2022-05-14 NOTE — Patient Instructions (Addendum)
It was great to see you! Thank you for allowing me to participate in your care!   I recommend that you always bring your medications to each appointment as this makes it easy to ensure we are on the correct medications and helps [place] not miss when refills are needed.  Our plans for today:  - I have referred you to a colonoscopy -Your blood pressure was elevated today, we will reduce your losartan to 25 mg daily but please take this every day, if feeling light headed let us know - We will have you come back in 2 weeks for a BP recheck   We are checking some labs today, I will call you if they are abnormal will send you a MyChart message or a letter if they are normal.  If you do not hear about your labs in the next 2 weeks please let us know.  Take care and seek immediate care sooner if you develop any concerns. Please remember to show up 15 minutes before your scheduled appointment time!  Gerrit Heck, MD [place]

## 2022-05-14 NOTE — Assessment & Plan Note (Addendum)
173/106>157/104 on recheck. Patient not adherant to losartan. I discussed option of taking 25 mg losartan daily however patient would like to take 50 mg daily. On further discussion patient says he does not have any lightheadedness Shared decision making employed. Discussed letting us know if he feels lightheaded at all and we can decrease dose. Plan to recheck BP in 2 weeks - Basic Metabolic Panel - Lipid Panel - Hepatitis C antibody

## 2022-05-14 NOTE — Progress Notes (Addendum)
New Patient Office Visit  Subjective    Patient ID: Ricardo [name], male    DOB: 1961/12/02  Age: 61 y.o. MRN: [id number]  CC:  Chief Complaint  Patient presents with   New Patient (Initial Visit)   Establish Care    HPI Ricardo [name] presents to establish care  Not taking losartan everyday, and says he only takes it if 130 or 140/89-90 BP at home. He denies any lightheadedness or falls. After taking losartan once he says it "stays down for 2 weeks" so he doesn't take it for another 2 weeks. Checks BP around 3-4 times daily. Denies any headache, vision changes, shortness of breath or chest pain, swelling or dizziness.  Outpatient Encounter Medications as of 05/14/2022  Medication Sig   albuterol (VENTOLIN HFA) 108 (90 Base) MCG/ACT inhaler Inhale 1-2 puffs into the lungs every 6 (six) hours as needed (cough).   aspirin EC 81 MG tablet Take 81 mg by mouth at bedtime as needed for moderate pain.   COD LIVER OIL PO Take by mouth daily.   fluticasone (VERAMYST) 27.5 MCG/SPRAY nasal spray Place 2 sprays into the nose daily.   Multiple Vitamin (MULTIVITAMIN) tablet Take 1 tablet by mouth daily.   naproxen sodium (ALEVE) 220 MG tablet Take 220 mg by mouth daily as needed (pain).   Omega-3 Fatty Acids (FISH OIL) 1000 MG CAPS Take by mouth.   [DISCONTINUED] fluticasone (FLONASE) 50 MCG/ACT nasal spray Place 1 spray into both nostrils daily.   cetirizine-pseudoephedrine (ZYRTEC-D) 5-120 MG tablet Take 1 tablet by mouth daily. (Patient not taking: Reported on 05/14/2022)   losartan (COZAAR) 50 MG tablet Take 1 tablet (50 mg total) by mouth daily.   [DISCONTINUED] amoxicillin-clavulanate (AUGMENTIN) 875-125 MG tablet Take 1 tablet by mouth every 12 (twelve) hours.   [DISCONTINUED] cetirizine (ZYRTEC ALLERGY) 10 MG tablet Take 1 tablet (10 mg total) by mouth daily for 7 days.   [DISCONTINUED] ibuprofen (ADVIL) 600 MG tablet Take 1 tablet (600 mg total) by mouth every 6 (six) hours as needed.    [DISCONTINUED] losartan (COZAAR) 50 MG tablet Take 1 tablet (50 mg total) by mouth daily.   [DISCONTINUED] methocarbamol (ROBAXIN) 500 MG tablet Take 1 tablet (500 mg total) by mouth 2 (two) times daily.   [DISCONTINUED] methylPREDNISolone (MEDROL DOSEPAK) 4 MG TBPK tablet Use as directed   No facility-administered encounter medications on file as of 05/14/2022.    Past Medical History:  Diagnosis Date   Allergy    Shingles    Sinus congestion     Past Surgical History:  Procedure Laterality Date   NO PAST SURGERIES      Family History  Problem Relation Age of Onset   Hyperlipidemia Father    Colon cancer Neg Hx    Colon polyps Neg Hx    Esophageal cancer Neg Hx    Rectal cancer Neg Hx    Stomach cancer Neg Hx     Social History   Socioeconomic History   Marital status: Married    Spouse name: Not on file   Number of children: Not on file   Years of education: Not on file   Highest education level: Not on file  Occupational History   Not on file  Tobacco Use   Smoking status: Never   Smokeless tobacco: Never  Vaping Use   Vaping Use: Never used  Substance and Sexual Activity   Alcohol use: No   Drug use: No   Sexual activity: Yes  Other Topics Concern   Not on file  Social History Narrative   Not on file   Social Determinants of Health   Financial Resource Strain: Not on file  Food Insecurity: Not on file  Transportation Needs: Not on file  Physical Activity: Not on file  Stress: Not on file  Social Connections: Not on file  Intimate Partner Violence: Not on file    Objective    BP (!) 157/104   Pulse 95   Ht 5\' 8"  (1.727 m)   Wt 195 lb (88.5 kg)   SpO2 99%   BMI 29.65 kg/m   Physical Exam Constitutional:      General: He is not in acute distress.    Appearance: Normal appearance. He is not toxic-appearing.  HENT:     Head: Normocephalic and atraumatic.     Mouth/Throat:     Mouth: Mucous membranes are moist.  Eyes:      Conjunctiva/sclera: Conjunctivae normal.  Cardiovascular:     Rate and Rhythm: Normal rate.  Pulmonary:     Effort: Pulmonary effort is normal.  Musculoskeletal:        General: Normal range of motion.  Skin:    General: Skin is warm and dry.  Neurological:     General: No focal deficit present.     Mental Status: He is alert.       Assessment & Plan:   Problem List Items Addressed This Visit       Cardiovascular and Mediastinum   Hypertension - Primary    173/106>157/104 on recheck. Patient not adherant to losartan. I discussed option of taking 25 mg losartan daily however patient would like to take 50 mg daily. On further discussion patient says he does not have any lightheadedness. Discussed letting know if he feels lightheaded at all and we can decrease dose. Plan to recheck BP in 2 weeks - Basic Metabolic Panel - Lipid Panel - Hepatitis C antibody       Relevant Orders   Basic Metabolic Panel   Lipid Panel   Hepatitis C antibody   Other Visit Diagnoses     Health care maintenance       Screening for colon cancer       Relevant Orders   Ambulatory referral to Gastroenterology       Discussed with patient professionalism in the [facility] office and boundary set with patient.   No follow-ups on file.   [facility], MD

## 2022-05-15 LAB — LIPID PANEL
Chol/HDL Ratio: 3 ratio (ref 0.0–5.0)
Cholesterol, Total: 161 mg/dL (ref 100–199)
HDL: 54 mg/dL (ref >39)
LDL Chol Calc (NIH): 97 mg/dL (ref 0–99)
Triglycerides: 48 mg/dL (ref 0–149)
VLDL Cholesterol Cal: 10 mg/dL (ref 5–40)

## 2022-05-15 LAB — BASIC METABOLIC PANEL
BUN/Creatinine Ratio: 14 (ref 10–24)
BUN: 13 mg/dL (ref 8–27)
CO2: 23 mmol/L (ref 20–29)
Calcium: 10 mg/dL (ref 8.6–10.2)
Chloride: 104 mmol/L (ref 96–106)
Creatinine, Ser: 0.92 mg/dL (ref 0.76–1.27)
Glucose: 86 mg/dL (ref 70–99)
Potassium: 4.1 mmol/L (ref 3.5–5.2)
Sodium: 139 mmol/L (ref 134–144)
eGFR: 95 mL/min/{1.73_m2} (ref >59)

## 2022-05-15 LAB — HEPATITIS C ANTIBODY: Hep C Virus Ab: NONREACTIVE

## 2022-05-16 ENCOUNTER — Encounter: Payer: Self-pay | Admitting: Student

## 2022-05-29 ENCOUNTER — Telehealth: Payer: Self-pay

## 2022-05-29 NOTE — Telephone Encounter (Signed)
Referral faxed to [facility].  They will call patient about an appt.  Ricardo [name]   Address: [address] [place] [address] Phone: [phone]

## 2022-06-19 ENCOUNTER — Ambulatory Visit (INDEPENDENT_AMBULATORY_CARE_PROVIDER_SITE_OTHER): Payer: Commercial Managed Care - HMO | Admitting: Student

## 2022-06-19 ENCOUNTER — Encounter: Payer: Self-pay | Admitting: Student

## 2022-06-19 DIAGNOSIS — I10 Essential (primary) hypertension: Secondary | ICD-10-CM | POA: Diagnosis not present

## 2022-06-19 NOTE — Patient Instructions (Addendum)
It was great to see you! Thank you for allowing me to participate in your care!   I recommend that you always bring your medications to each appointment as this makes it easy to ensure we are on the correct medications and helps [place] not miss when refills are needed.  Our plans for today:  - We iwll continue the losartan at 50 mg daily since you are saying your home BP are normal/low. I am referring you to pharmacy clinic for ambulatory BP monitoring to see what your blood pressure looks like over 24 hours to understand what you should be on  -stop at front desk to schedule this appointment  Take care and seek immediate care sooner if you develop any concerns. Please remember to show up 15 minutes before your scheduled appointment time!  Levin Erp, MD [facility]

## 2022-06-19 NOTE — Progress Notes (Unsigned)
    SUBJECTIVE:   CHIEF COMPLAINT / HPI:   Hypertension: Patient is a 61 y.o. male who present today for follow up of hypertension.   Patient endorses {[id number]}  Home medications include: losartan 50 mg daily takes it every day. Denies any headache, vision changes, shortness of breath or chest pain  Patient endorses taking these medications as prescribed.  Most recent creatinine trend:  Lab Results  Component Value Date   CREATININE 0.92 05/14/2022   CREATININE 1.09 12/21/2016   Patient does check blood pressure at home. 133/87 and 115/81 highest 133/92  Patient has had a BMP in the past 1 year.  Has some snoring, no waking up choking  ***GI colonoscopy  PERTINENT  PMH / PSH: ***  OBJECTIVE:   BP (!) 173/120   Pulse 92   Ht 5\' 8"  (1.727 m)   Wt 194 lb 12.8 oz (88.4 kg)   SpO2 100%   BMI 29.62 kg/m   ***  ASSESSMENT/PLAN:   No problem-specific Assessment & Plan notes found for this encounter.     , MD [facility]

## 2022-06-20 ENCOUNTER — Telehealth: Payer: Self-pay | Admitting: Student

## 2022-06-20 NOTE — Assessment & Plan Note (Signed)
Discrepancy between in office readings (170s/100s) from home readings (115-130s/80s). Plan to continue losartan 50 mg daily and have patient follow in pharmacy clinic for ambulatory BP monitoring. Return precautions discussed

## 2022-06-20 NOTE — Telephone Encounter (Signed)
Left VM for patient for [facility] number and address for referral

## 2022-06-28 ENCOUNTER — Ambulatory Visit: Payer: Commercial Managed Care - HMO | Admitting: Pharmacist

## 2022-07-02 ENCOUNTER — Other Ambulatory Visit: Payer: Self-pay

## 2022-07-03 MED ORDER — LOSARTAN POTASSIUM 50 MG PO TABS: 50.0000 mg | ORAL_TABLET | Freq: Every day | ORAL | 0 refills | Status: DC

## 2022-07-04 ENCOUNTER — Ambulatory Visit: Payer: Commercial Managed Care - HMO | Admitting: Pharmacist

## 2022-07-05 ENCOUNTER — Telehealth: Payer: Self-pay

## 2022-07-05 ENCOUNTER — Other Ambulatory Visit: Payer: Self-pay | Admitting: Student

## 2022-07-05 DIAGNOSIS — J302 Other seasonal allergic rhinitis: Secondary | ICD-10-CM

## 2022-07-05 MED ORDER — ALBUTEROL SULFATE HFA 108 (90 BASE) MCG/ACT IN AERS: 1.0000 | INHALATION_SPRAY | Freq: Four times a day (QID) | RESPIRATORY_TRACT | 0 refills | Status: AC | PRN

## 2022-07-05 NOTE — Telephone Encounter (Signed)
Patient calls nurse line requesting refill on atrovent inhaler.   Patient was prescribed this at ED visit on 03/02/2022.  This is not on current medication list.   If appropriate, please send to [facility].   Veronda Prude, RN

## 2022-07-16 ENCOUNTER — Ambulatory Visit: Payer: Commercial Managed Care - HMO | Admitting: Pharmacist

## 2022-07-18 ENCOUNTER — Ambulatory Visit: Payer: Commercial Managed Care - HMO | Admitting: Pharmacist

## 2022-07-20 ENCOUNTER — Emergency Department (HOSPITAL_COMMUNITY): Payer: Commercial Managed Care - HMO

## 2022-07-20 ENCOUNTER — Other Ambulatory Visit: Payer: Self-pay

## 2022-07-20 ENCOUNTER — Emergency Department (HOSPITAL_COMMUNITY)
Admission: EM | Admit: 2022-07-20 | Discharge: 2022-07-20 | Disposition: A | Discharge status: Home / Self Care | Payer: Commercial Managed Care - HMO | Attending: Emergency Medicine | Admitting: Emergency Medicine

## 2022-07-20 DIAGNOSIS — Z7982 Long term (current) use of aspirin: Secondary | ICD-10-CM | POA: Diagnosis not present

## 2022-07-20 DIAGNOSIS — Z20822 Contact with and (suspected) exposure to covid-19: Secondary | ICD-10-CM | POA: Diagnosis not present

## 2022-07-20 DIAGNOSIS — J0110 Acute frontal sinusitis, unspecified: Secondary | ICD-10-CM | POA: Diagnosis not present

## 2022-07-20 DIAGNOSIS — I4891 Unspecified atrial fibrillation: Secondary | ICD-10-CM | POA: Insufficient documentation

## 2022-07-20 DIAGNOSIS — H9201 Otalgia, right ear: Secondary | ICD-10-CM | POA: Diagnosis present

## 2022-07-20 LAB — BASIC METABOLIC PANEL
Anion gap: 8 (ref 5–15)
BUN: 15 mg/dL (ref 8–23)
CO2: 26 mmol/L (ref 22–32)
Calcium: 9.8 mg/dL (ref 8.9–10.3)
Chloride: 108 mmol/L (ref 98–111)
Creatinine, Ser: 1.06 mg/dL (ref 0.61–1.24)
GFR, Estimated: >60 mL/min (ref >60)
Glucose, Bld: 91 mg/dL (ref 70–99)
Potassium: 3.7 mmol/L (ref 3.5–5.1)
Sodium: 142 mmol/L (ref 135–145)

## 2022-07-20 LAB — CBC WITH DIFFERENTIAL/PLATELET
Abs Immature Granulocytes: 0 10*3/uL (ref 0.00–0.07)
Basophils Absolute: 0 10*3/uL (ref 0.0–0.1)
Basophils Relative: 1 %
Eosinophils Absolute: 0.1 10*3/uL (ref 0.0–0.5)
Eosinophils Relative: 2 %
HCT: 45.8 % (ref 39.0–52.0)
Hemoglobin: 15.3 g/dL (ref 13.0–17.0)
Immature Granulocytes: 0 %
Lymphocytes Relative: 32 %
Lymphs Abs: 1.7 10*3/uL (ref 0.7–4.0)
MCH: 29.2 pg (ref 26.0–34.0)
MCHC: 33.4 g/dL (ref 30.0–36.0)
MCV: 87.4 fL (ref 80.0–100.0)
Monocytes Absolute: 0.4 10*3/uL (ref 0.1–1.0)
Monocytes Relative: 8 %
Neutro Abs: 3.1 10*3/uL (ref 1.7–7.7)
Neutrophils Relative %: 57 %
Platelets: 190 10*3/uL (ref 150–400)
RBC: 5.24 MIL/uL (ref 4.22–5.81)
RDW: 12.9 % (ref 11.5–15.5)
WBC: 5.4 10*3/uL (ref 4.0–10.5)
nRBC: 0 % (ref 0.0–0.2)

## 2022-07-20 LAB — RESP PANEL BY RT-PCR (FLU A&B, COVID) ARPGX2
Influenza A by PCR: NEGATIVE
Influenza B by PCR: NEGATIVE
SARS Coronavirus 2 by RT PCR: NEGATIVE

## 2022-07-20 LAB — MAGNESIUM: Magnesium: 2.4 mg/dL (ref 1.7–2.4)

## 2022-07-20 LAB — TROPONIN I (HIGH SENSITIVITY): Troponin I (High Sensitivity): 4 ng/L (ref <18)

## 2022-07-20 MED ORDER — CETIRIZINE HCL 10 MG PO TABS: 10.0000 mg | ORAL_TABLET | Freq: Every day | ORAL | 1 refills | Status: DC

## 2022-07-20 MED ORDER — METOPROLOL SUCCINATE ER 25 MG PO TB24: 25.0000 mg | ORAL_TABLET | Freq: Every day | ORAL | 0 refills | Status: DC

## 2022-07-20 MED ORDER — AMOXICILLIN-POT CLAVULANATE 875-125 MG PO TABS
1.0000 | ORAL_TABLET | Freq: Two times a day (BID) | ORAL | 0 refills | Status: DC
Start: 2022-07-20 — End: 2022-08-15

## 2022-07-20 MED ORDER — AMOXICILLIN-POT CLAVULANATE 875-125 MG PO TABS
1.0000 | ORAL_TABLET | Freq: Once | ORAL | Status: AC
  Administered 2022-07-20: 1 via ORAL
  Filled 2022-07-20: qty 1

## 2022-07-20 MED ORDER — ACETAMINOPHEN 325 MG PO TABS
650.0000 mg | ORAL_TABLET | Freq: Once | ORAL | Status: AC
  Administered 2022-07-20: 650 mg via ORAL

## 2022-07-20 MED ORDER — FLUTICASONE PROPIONATE 50 MCG/ACT NA SUSP: 2.0000 | Freq: Every day | NASAL | 0 refills | Status: DC

## 2022-07-20 MED ORDER — ASPIRIN 81 MG PO CHEW: 81.0000 mg | CHEWABLE_TABLET | Freq: Every day | ORAL | 1 refills | Status: AC

## 2022-07-20 MED ORDER — METOPROLOL TARTRATE 25 MG PO TABS
25.0000 mg | ORAL_TABLET | Freq: Once | ORAL | Status: AC
  Administered 2022-07-20: 25 mg via ORAL
  Filled 2022-07-20: qty 1

## 2022-07-20 MED ORDER — ASPIRIN 81 MG PO CHEW
81.0000 mg | CHEWABLE_TABLET | ORAL | Status: AC
Start: 2022-07-20 — End: 2022-07-20
  Administered 2022-07-20: 81 mg via ORAL
  Filled 2022-07-20: qty 1

## 2022-07-20 NOTE — ED Triage Notes (Signed)
Patient coming to ED for evaluation of sinus congestion x 2 weeks.  Reports pressure, HA, and L ear pain.  No reports of fever or cough

## 2022-07-20 NOTE — ED Provider Notes (Incomplete)
[facility]-EMERGENCY DEPT Provider Note   CSN: [id number] Arrival date & time: 07/20/22  1913     History {Add pertinent medical, surgical, social history, OB history to HPI:1} Chief Complaint  Patient presents with  . Nasal Congestion  . Otalgia    AC COLAN is a 61 y.o. male.   Otalgia Patient is a 61 year old male with a past medical history significant for sinus congestion, shingles, allergies, high blood pressure on        Home Medications Prior to Admission medications   Medication Sig Start Date End Date Taking? Authorizing Provider  amoxicillin-clavulanate (AUGMENTIN) 875-125 MG tablet Take 1 tablet by mouth every 12 (twelve) hours. 07/20/22  Yes Solon Augusta S, PA  aspirin 81 MG chewable tablet Chew 1 tablet (81 mg total) by mouth daily. 07/20/22  Yes Santia Labate S, PA  fluticasone (FLONASE) 50 MCG/ACT nasal spray Place 2 sprays into both nostrils daily for 14 days. 07/20/22 08/03/22 Yes Deshonna Trnka S, PA  albuterol (VENTOLIN HFA) 108 (90 Base) MCG/ACT inhaler Inhale 1-2 puffs into the lungs every 6 (six) hours as needed (cough). 07/05/22   Levin Erp, MD  cetirizine-pseudoephedrine (ZYRTEC-D) 5-120 MG tablet Take 1 tablet by mouth daily. 09/18/20   Dartha Lodge, PA-C  COD LIVER OIL PO Take by mouth daily.    [provider]  losartan (COZAAR) 50 MG tablet Take 1 tablet (50 mg total) by mouth daily. 07/03/22   Levin Erp, MD  metoprolol succinate (TOPROL-XL) 25 MG 24 hr tablet Take 1 tablet (25 mg total) by mouth daily. 07/20/22   Gailen Shelter, PA  Multiple Vitamin (MULTIVITAMIN) tablet Take 1 tablet by mouth daily.    [provider]  naproxen sodium (ALEVE) 220 MG tablet Take 220 mg by mouth daily as needed (pain).    [provider]  Omega-3 Fatty Acids (FISH OIL) 1000 MG CAPS Take by mouth.    [provider]  cetirizine (ZYRTEC ALLERGY) 10 MG tablet Take 1 tablet (10 mg total) by  mouth daily for 7 days. 11/17/19 02/13/20  Gailen Shelter, PA      Allergies    Patient has no known allergies.    Review of Systems   Review of Systems  HENT:  Positive for ear pain.     Physical Exam Updated Vital Signs BP (!) 130/103   Pulse 83   Temp 98.2 F (36.8 C) (Oral)   Resp 16   Ht 5\' 8"  (1.727 m)   Wt 84.8 kg   SpO2 99%   BMI 28.43 kg/m  Physical Exam  ED Results / Procedures / Treatments   Labs (all labs ordered are listed, but only abnormal results are displayed) Labs Reviewed  RESP PANEL BY RT-PCR (FLU A&B, COVID) ARPGX2  BASIC METABOLIC PANEL  MAGNESIUM  CBC WITH DIFFERENTIAL/PLATELET  TROPONIN I (HIGH SENSITIVITY)    EKG EKG Interpretation  Date/Time:  Friday July 20 2022 19:49:03 EDT Ventricular Rate:  127 PR Interval:    QRS Duration: 86 QT Interval:  329 QTC Calculation: 482 R Axis:   270 Text Interpretation: Atrial fibrillation /flutter Left anterior fascicular block ST elevation, consider inferior injury Borderline prolonged QT interval no old comparison Confirmed by [id number] [phone]) on 07/20/2022 7:54:46 PM  Radiology DG Chest 2 View  Result Date: 07/20/2022 CLINICAL DATA:  Atrial fibrillation, headaches and ear pain for 2 weeks EXAM: CHEST - 2 VIEW COMPARISON:  10/11/2021 FINDINGS: The heart size and mediastinal  contours are within normal limits. Both lungs are clear. The visualized skeletal structures are unremarkable. IMPRESSION: No active cardiopulmonary disease. Electronically Signed   By: Elige Ko M.D.   On: 07/20/2022 20:23    Procedures Procedures  {Document cardiac monitor, telemetry assessment procedure when appropriate:1}  Medications Ordered in ED Medications  acetaminophen (TYLENOL) tablet 650 mg (650 mg Oral Given 07/20/22 2225)  aspirin chewable tablet 81 mg (81 mg Oral Given 07/20/22 2229)  metoprolol tartrate (LOPRESSOR) tablet 25 mg (25 mg Oral Given 07/20/22 2229)  amoxicillin-clavulanate (AUGMENTIN)  875-125 MG per tablet 1 tablet (1 tablet Oral Given 07/20/22 2229)    ED Course/ Medical Decision Making/ A&P                           Medical Decision Making Risk OTC drugs. Prescription drug management.   ***  {Document critical care time when appropriate:1} {Document review of labs and clinical decision tools ie heart score, Chads2Vasc2 etc:1}  {Document your independent review of radiology images, and any outside records:1} {Document your discussion with family members, caretakers, and with consultants:1} {Document social determinants of health affecting pt's care:1} {Document your decision making why or why not admission, treatments were needed:1} Final Clinical Impression(s) / ED Diagnoses Final diagnoses:  Acute non-recurrent frontal sinusitis  Atrial fibrillation, unspecified type (HCC)    Rx / DC Orders ED Discharge Orders          Ordered    metoprolol succinate (TOPROL-XL) 25 MG 24 hr tablet  Daily,   Status:  Discontinued        07/20/22 2214    aspirin 81 MG chewable tablet  Daily        07/20/22 2214    Amb Referral to AFIB Clinic        07/20/22 2215    amoxicillin-clavulanate (AUGMENTIN) 875-125 MG tablet  Every 12 hours        07/20/22 2216    fluticasone (FLONASE) 50 MCG/ACT nasal spray  Daily        07/20/22 2249    metoprolol succinate (TOPROL-XL) 25 MG 24 hr tablet  Daily        07/20/22 2303

## 2022-07-20 NOTE — ED Provider Notes (Signed)
[facility]-EMERGENCY DEPT Provider Note   CSN: [id number] Arrival date & time: 07/20/22  1913     History  Chief Complaint  Patient presents with   Nasal Congestion   Otalgia    Ricardo [name] is a 61 y.o. male.   Otalgia Patient is a 61 year old male with a past medical history significant for sinus congestion, shingles, allergies, high blood pressure   Patient is present emergency room today with complaints of approximately 2 weeks of sinus congestion fatigue sinus pressure, ear pain seems to be affecting both ears but worse in the left ear and some chills.  He reports occasional dry cough.  No fevers at home no chest pain difficulty breathing no heart palpitations no lightheadedness or dizziness.  No other associate symptoms.      Home Medications Prior to Admission medications   Medication Sig Start Date End Date Taking? Authorizing Provider  amoxicillin-clavulanate (AUGMENTIN) 875-125 MG tablet Take 1 tablet by mouth every 12 (twelve) hours. 07/20/22  Yes Solon Augusta S, PA  aspirin 81 MG chewable tablet Chew 1 tablet (81 mg total) by mouth daily. 07/20/22  Yes Bennet Kujawa S, PA  fluticasone (FLONASE) 50 MCG/ACT nasal spray Place 2 sprays into both nostrils daily for 14 days. 07/20/22 08/03/22 Yes Caydyn Sprung S, PA  albuterol (VENTOLIN HFA) 108 (90 Base) MCG/ACT inhaler Inhale 1-2 puffs into the lungs every 6 (six) hours as needed (cough). 07/05/22   Levin Erp, MD  cetirizine-pseudoephedrine (ZYRTEC-D) 5-120 MG tablet Take 1 tablet by mouth daily. 09/18/20   Dartha Lodge, PA-C  COD LIVER OIL PO Take by mouth daily.    [provider]  losartan (COZAAR) 50 MG tablet Take 1 tablet (50 mg total) by mouth daily. 07/03/22   Levin Erp, MD  metoprolol succinate (TOPROL-XL) 25 MG 24 hr tablet Take 1 tablet (25 mg total) by mouth daily. 07/20/22   Gailen Shelter, PA  Multiple Vitamin (MULTIVITAMIN) tablet Take 1 tablet by mouth daily.     [provider]  naproxen sodium (ALEVE) 220 MG tablet Take 220 mg by mouth daily as needed (pain).    [provider]  Omega-3 Fatty Acids (FISH OIL) 1000 MG CAPS Take by mouth.    [provider]  cetirizine (ZYRTEC ALLERGY) 10 MG tablet Take 1 tablet (10 mg total) by mouth daily for 7 days. 11/17/19 02/13/20  Gailen Shelter, PA      Allergies    Patient has no known allergies.    Review of Systems   Review of Systems  HENT:  Positive for ear pain.     Physical Exam Updated Vital Signs BP (!) 130/103   Pulse 83   Temp 98.2 F (36.8 C) (Oral)   Resp 16   Ht 5\' 8"  (1.727 m)   Wt 84.8 kg   SpO2 99%   BMI 28.43 kg/m  Physical Exam Vitals and nursing note reviewed.  Constitutional:      General: He is not in acute distress.    Appearance: He is obese.     Comments: Pleasant well-appearing 61 year old.  In no acute distress.  Sitting comfortably in bed.  Able answer questions appropriately follow commands. No increased work of breathing. Speaking in full sentences.   HENT:     Head: Normocephalic and atraumatic.     Comments: Maxillary and frontal sinus tenderness    Nose: Nose normal.  Eyes:     General: No scleral icterus. Cardiovascular:  Rate and Rhythm: Normal rate. Rhythm irregular.     Pulses: Normal pulses.     Heart sounds: Normal heart sounds.  Pulmonary:     Effort: Pulmonary effort is normal. No respiratory distress.     Breath sounds: No wheezing.  Abdominal:     Palpations: Abdomen is soft.     Tenderness: There is no abdominal tenderness. There is no guarding or rebound.     Comments: Soft protuberant abdomen  Musculoskeletal:     Cervical back: Normal range of motion.     Right lower leg: No edema.     Left lower leg: No edema.     Comments: No lower extremity edema.  No calf tenderness  Skin:    General: Skin is warm and dry.     Capillary Refill: Capillary refill takes less than 2 seconds.  Neurological:      Mental Status: He is alert. Mental status is at baseline.  Psychiatric:        Mood and Affect: Mood normal.        Behavior: Behavior normal.     ED Results / Procedures / Treatments   Labs (all labs ordered are listed, but only abnormal results are displayed) Labs Reviewed  RESP PANEL BY RT-PCR (FLU A&B, COVID) ARPGX2  BASIC METABOLIC PANEL  MAGNESIUM  CBC WITH DIFFERENTIAL/PLATELET  TROPONIN I (HIGH SENSITIVITY)    EKG EKG Interpretation  Date/Time:  Friday July 20 2022 19:49:03 EDT Ventricular Rate:  127 PR Interval:    QRS Duration: 86 QT Interval:  329 QTC Calculation: 482 R Axis:   270 Text Interpretation: Atrial fibrillation /flutter Left anterior fascicular block ST elevation, consider inferior injury Borderline prolonged QT interval no old comparison Confirmed by Arby Barrette [phone]) on 07/20/2022 7:54:46 PM  Radiology DG Chest 2 View  Result Date: 07/20/2022 CLINICAL DATA:  Atrial fibrillation, headaches and ear pain for 2 weeks EXAM: CHEST - 2 VIEW COMPARISON:  10/11/2021 FINDINGS: The heart size and mediastinal contours are within normal limits. Both lungs are clear. The visualized skeletal structures are unremarkable. IMPRESSION: No active cardiopulmonary disease. Electronically Signed   By: Elige Ko M.D.   On: 07/20/2022 20:23    Procedures Procedures    Medications Ordered in ED Medications  acetaminophen (TYLENOL) tablet 650 mg (650 mg Oral Given 07/20/22 2225)  aspirin chewable tablet 81 mg (81 mg Oral Given 07/20/22 2229)  metoprolol tartrate (LOPRESSOR) tablet 25 mg (25 mg Oral Given 07/20/22 2229)  amoxicillin-clavulanate (AUGMENTIN) 875-125 MG per tablet 1 tablet (1 tablet Oral Given 07/20/22 2229)    ED Course/ Medical Decision Making/ A&P                           Medical Decision Making Risk OTC drugs. Prescription drug management.    This patient presents to the ED for concern of sinus pain, sinus pressure, this involves a number  of treatment options, and is a complaint that carries with it a moderate to high risk of complications and morbidity.  The differential diagnosis includes sinusitis could be viral or bacterial given amount of time will treat with antibiotics empirically.  Patient also found to have irregular heartbeat and EKG shows atrial fibrillation versus atrial flutter.   Co morbidities: Discussed in HPI   Brief History:  Patient is a 61 year old male with a past medical history significant for sinus congestion, shingles, allergies, high blood pressure   Patient is present emergency room  today with complaints of approximately 2 weeks of sinus congestion fatigue sinus pressure, ear pain seems to be affecting both ears but worse in the left ear and some chills.  He reports occasional dry cough.  No fevers at home no chest pain difficulty breathing no heart palpitations no lightheadedness or dizziness.  No other associate symptoms.     EMR reviewed including pt PMHx, past surgical history and past visits to ER.   See HPI for more details   Lab Tests:   I personally reviewed all laboratory work and imaging. Metabolic panel without any acute abnormality specifically kidney function within normal limits and no significant electrolyte abnormalities. CBC without leukocytosis or significant anemia. Troponin x1 within normal limits, mag within normal limits, COVID influenza negative  Imaging Studies:  NAD. I personally reviewed all imaging studies and no acute abnormality found. I agree with radiology interpretation. Chest x-ray unremarkable   Cardiac Monitoring:  The patient was maintained on a cardiac monitor.  I personally viewed and interpreted the cardiac monitored which showed an underlying rhythm of: Atrial fibrillation heart rate between 80s-110s EKG non-ischemic atrial fibrillation versus flutter   Medicines ordered:  I ordered medication including Augmentin, aspirin, metoprolol,  Tylenol  Reevaluation of the patient after these medicines showed that the patient stayed the same I have reviewed the patients home medicines and have made adjustments as needed   Critical Interventions:  CHADSVASC score CHA<sub>2</sub>DS<sub>2</sub>-VASc Score for Atrial Fibrillation Stroke Risk from [email or web address]  on 07/21/2022 ** All calculations should be rechecked by clinician prior to use **  RESULT SUMMARY: 1 points Stroke risk was 0.6% per year in >90,000 patients (the [place] Atrial Fibrillation Cohort Study) and 0.9% risk of stroke/TIA/systemic embolism.  One recommendation suggests a 0 score for men or 1 score for women (no clinical risk factors) is "low" risk and may not require anticoagulation; a 1 score for men or 2 score for women is "low-moderate" risk and should consider antiplatelet or anticoagulation; and a score ?2 for men or ?3 for women is "moderate-high" risk and should otherwise be an anticoagulation candidate.   INPUTS: Age --> 0 = <65 Sex --> 0 = Male CHF history --> 0 = No Hypertension history --> 1 = Yes Stroke/TIA/thromboembolism history --> 0 = No Vascular disease history (prior MI, peripheral artery disease, or aortic plaque) --> 0 = No Diabetes history --> 0 = No  Patient has a CHA2DS2-VASc score of 1.  Discussed my attending physician who agrees with my plan to start on daily aspirin.  We will follow-up with A-fib clinic and discussed with patient that he may be placed on blood thinners at the discretion of the cardiologist he sees in follow-up.  He is agreeable to plan.  Consults/Attending Physician   I discussed this case with my attending physician who cosigned this note including patient's presenting symptoms, physical exam, and planned diagnostics and interventions. Attending physician stated agreement with plan or made changes to plan which were implemented.   Reevaluation:  After the interventions noted above I re-evaluated patient and found  that they have :improved   Social Determinants of Health:      Problem List / ED Course:  Sinusitis treated empirically in concordance with IDSA guidelines for 2-week duration of sinus infection symptoms plus frontal and maxillary sinus tenderness --treat with Augmentin Atrial fibrillation.  CHA2DS2-VASc of 1.  Will treat with daily aspirin and provide 25 mg Lopressor XL.  Patient's heart rate is in the 80s at time of  discharge.  Blood pressure is 130/100.  Patient is asymptomatic from a A-fib standpoint.  I doubt that his atrial fibrillation has been precipitated by ACS given normal troponin and lack of chest pain also doubt PE.  He will follow-up with A-fib clinic.  No physical exam evidence of hyperthyroidism we will hold off on TSH being added on.   Dispostion:  After consideration of the diagnostic results and the patients response to treatment, I feel that the patent would benefit from close follow-up with A-fib clinic. Strict return precautions given   Final Clinical Impression(s) / ED Diagnoses Final diagnoses:  Acute non-recurrent frontal sinusitis  Atrial fibrillation, unspecified type ([place])    Rx / DC Orders ED Discharge Orders          Ordered    metoprolol succinate (TOPROL-XL) 25 MG 24 hr tablet  Daily,   Status:  Discontinued        07/20/22 2214    aspirin 81 MG chewable tablet  Daily        07/20/22 2214    Amb Referral to AFIB Clinic        07/20/22 2215    amoxicillin-clavulanate (AUGMENTIN) 875-125 MG tablet  Every 12 hours        07/20/22 2216    fluticasone (FLONASE) 50 MCG/ACT nasal spray  Daily        07/20/22 2249    metoprolol succinate (TOPROL-XL) 25 MG 24 hr tablet  Daily        07/20/22 2303              Tedd Sias, [place] 07/21/22 1835    Hayden Rasmussen, MD 07/22/22 [phone]

## 2022-07-20 NOTE — ED Provider Triage Note (Signed)
Emergency Medicine Provider Triage Evaluation Note  Ricardo [name] , a 62 y.o. male  was evaluated in triage.  Pt complains of sinus congestion, headache and ear pain for about 2 weeks with some chills.  He reports an occasional cough.  No measured fevers.  No chest pain, shortness of breath or palpitations..  Review of Systems  Positive: Nasal congestion, sinus pressure, headache, left ear pain Negative: Fever, chest pain, shortness of breath  Physical Exam  BP (!) 147/84   Pulse 99   Temp 99.2 F (37.3 C) (Oral)   Resp 18   Ht 5\' 8"  (1.727 m)   Wt 84.8 kg   SpO2 100%   BMI 28.43 kg/m  Gen:   Awake, no distress   Resp:  Normal effort, CTA bilat Cardiac: Rapid and irregular heart rate MSK:   Moves extremities without difficulty  Other:  Nasal congestion and edema noted with some erythema of the left TM  Medical Decision Making  Medically screening exam initiated at 7:38 PM.  Appropriate orders placed.  Ricardo [name] was informed that the remainder of the evaluation will be completed by another provider, this initial triage assessment does not replace that evaluation, and the importance of remaining in the ED until their evaluation is complete.  Patient found to be in A-fib on arrival with heart rates in the 120s-150s, no known history of A-fib.  Has sinus infection, but will need further evaluation for new onset A-fib RVR   Virgel Paling, Dartha Lodge 07/20/22 1957

## 2022-07-20 NOTE — Discharge Instructions (Addendum)
You were incidentally found to have atrial fibrillation today.  This is a heart issue where you are atria or top of heart fibrillates or shakes.  This can sometimes cause symptoms such as lightheadedness, chest pain, heart palpitations but can sometimes be asymptomatic.  As we discussed I would like you to start taking an 81 mg tablet of aspirin once daily, please take the metoprolol I have prescribed you this will help slow down your heart rate and will slightly adjust your blood pressure as well you will need to follow-up at the atrial fibrillation clinic.  I have given you their information.  Please read the attached information on atrial fibrillation.  Refrain from alcohol, any stimulating medications like cocaine.  In terms of your sinus congestion please take the Augmentin as prescribed.  Continue to do the sinus rinses and take Zyrtec.  Is very important to use Flonase as prescribed/discussed.  Please stop taking the Zyrtec D as this does have Sudafed in it

## 2022-07-25 ENCOUNTER — Ambulatory Visit: Payer: Commercial Managed Care - HMO | Admitting: Pharmacist

## 2022-07-27 ENCOUNTER — Ambulatory Visit (HOSPITAL_COMMUNITY): Payer: Commercial Managed Care - HMO | Admitting: Internal Medicine

## 2022-08-03 ENCOUNTER — Other Ambulatory Visit: Payer: Self-pay | Admitting: Student

## 2022-08-13 ENCOUNTER — Ambulatory Visit: Payer: Commercial Managed Care - HMO | Admitting: Pharmacist

## 2022-08-13 ENCOUNTER — Inpatient Hospital Stay (HOSPITAL_COMMUNITY)
Admission: RE | Admit: 2022-08-13 | Payer: Commercial Managed Care - HMO | Source: Ambulatory Visit | Admitting: Physician Assistant

## 2022-08-15 ENCOUNTER — Encounter (HOSPITAL_COMMUNITY): Payer: Self-pay | Admitting: Physician Assistant

## 2022-08-15 ENCOUNTER — Ambulatory Visit (HOSPITAL_COMMUNITY)
Admission: RE | Admit: 2022-08-15 | Discharge: 2022-08-15 | Disposition: A | Discharge status: Home / Self Care | Payer: Commercial Managed Care - HMO | Source: Ambulatory Visit | Attending: Physician Assistant | Admitting: Physician Assistant

## 2022-08-15 VITALS — BP 148/92 | HR 81 | Ht 68.0 in | Wt 191.0 lb

## 2022-08-15 DIAGNOSIS — I484 Atypical atrial flutter: Secondary | ICD-10-CM

## 2022-08-15 DIAGNOSIS — I10 Essential (primary) hypertension: Secondary | ICD-10-CM | POA: Insufficient documentation

## 2022-08-15 DIAGNOSIS — I4892 Unspecified atrial flutter: Secondary | ICD-10-CM | POA: Diagnosis present

## 2022-08-15 MED ORDER — METOPROLOL SUCCINATE ER 25 MG PO TB24: 25.0000 mg | ORAL_TABLET | Freq: Every day | ORAL | 3 refills | Status: DC

## 2022-08-15 NOTE — Progress Notes (Signed)
Primary Care Physician: Levin Erp, MD Primary Cardiologist: none Primary Electrophysiologist: none Referring Physician: [facility]   Ricardo [name] is a 61 y.o. male with a history of HTN and atrial flutter who presents for consultation in the [facility].  The patient was initially diagnosed with atrial flutter after presenting to the ED with nasal congestion and ear pain. He was treated for sinusitis and incidentally found to be in atrial flutter. He was started on metoprolol for rate control. Patient has a CHADS2VASC score of 1. Patient was unaware of his arrhythmia, back in SR today. He is currently out of metoprolol.   Today, he denies symptoms of palpitations, chest pain, shortness of breath, orthopnea, PND, lower extremity edema, dizziness, presyncope, syncope, snoring, daytime somnolence, bleeding, or neurologic sequela. The patient is tolerating medications without difficulties and is otherwise without complaint today.    Atrial Fibrillation Risk Factors:  he does not have symptoms or diagnosis of sleep apnea. he does not have a history of rheumatic fever. he does not have a history of alcohol use. The patient does not have a history of early familial atrial fibrillation or other arrhythmias.  he has a BMI of Body mass index is 29.04 kg/m.Marland Kitchen Filed Weights   08/15/22 1520  Weight: 86.6 kg    Family History  Problem Relation Age of Onset   Hyperlipidemia Father    Colon cancer Neg Hx    Colon polyps Neg Hx    Esophageal cancer Neg Hx    Rectal cancer Neg Hx    Stomach cancer Neg Hx      Atrial Fibrillation Management history:  Previous antiarrhythmic drugs: none Previous cardioversions: none Previous ablations: none CHADS2VASC score: 1 Anticoagulation history: none   Past Medical History:  Diagnosis Date   Allergy    Shingles    Sinus congestion    Past Surgical History:  Procedure Laterality Date   NO PAST  SURGERIES      Current Outpatient Medications  Medication Sig Dispense Refill   albuterol (VENTOLIN HFA) 108 (90 Base) MCG/ACT inhaler Inhale 1-2 puffs into the lungs every 6 (six) hours as needed (cough). 8 g 0   aspirin 81 MG chewable tablet Chew 1 tablet (81 mg total) by mouth daily. 30 tablet 1   cetirizine (ZYRTEC ALLERGY) 10 MG tablet Take 1 tablet (10 mg total) by mouth daily. 14 tablet 1   COD LIVER OIL PO Take by mouth daily.     fluticasone (FLONASE) 50 MCG/ACT nasal spray Place 2 sprays into both nostrils daily for 14 days. 11.1 mL 0   losartan (COZAAR) 50 MG tablet Take 1 tablet by mouth once daily 30 tablet 0   metoprolol succinate (TOPROL-XL) 25 MG 24 hr tablet Take 1 tablet (25 mg total) by mouth daily. 14 tablet 0   Multiple Vitamin (MULTIVITAMIN) tablet Take 1 tablet by mouth daily.     naproxen sodium (ALEVE) 220 MG tablet Take 220 mg by mouth daily as needed (pain).     Omega-3 Fatty Acids (FISH OIL) 1000 MG CAPS Take by mouth.     No current facility-administered medications for this encounter.    No Known Allergies  Social History   Socioeconomic History   Marital status: Married    Spouse name: Not on file   Number of children: Not on file   Years of education: Not on file   Highest education level: Not on file  Occupational History  Not on file  Tobacco Use   Smoking status: Never   Smokeless tobacco: Never   Tobacco comments:    Never smoke 08/15/22  Vaping Use   Vaping Use: Never used  Substance and Sexual Activity   Alcohol use: No   Drug use: No   Sexual activity: Yes  Other Topics Concern   Not on file  Social History Narrative   Not on file   Social Determinants of Health   Financial Resource Strain: Not on file  Food Insecurity: Not on file  Transportation Needs: Not on file  Physical Activity: Not on file  Stress: Not on file  Social Connections: Not on file  Intimate Partner Violence: Not on file     ROS- All systems are  reviewed and negative except as per the HPI above.  Physical Exam: Vitals:   08/15/22 1520  BP: (!) 148/92  Pulse: 81  Weight: 86.6 kg  Height: 5\' 8"  (1.727 m)    GEN- The patient is a well appearing male, alert and oriented x 3 today.   Head- normocephalic, atraumatic Eyes-  Sclera clear, conjunctiva pink Ears- hearing intact Oropharynx- clear Neck- supple  Lungs- Clear to ausculation bilaterally, normal work of breathing Heart- Regular rate and rhythm, no murmurs, rubs or gallops  GI- soft, NT, ND, + BS Extremities- no clubbing, cyanosis, or edema MS- no significant deformity or atrophy Skin- no rash or lesion Psych- euthymic mood, full affect Neuro- strength and sensation are intact  Wt Readings from Last 3 Encounters:  08/15/22 86.6 kg  07/20/22 84.8 kg  06/19/22 88.4 kg    EKG today demonstrates  SR Vent. rate 81 BPM PR interval 188 ms QRS duration 84 ms QT/QTcB 372/432 ms   Epic records are reviewed at length today  CHA2DS2-VASc Score = 1  The patient's score is based upon: CHF History: 0 HTN History: 1 Diabetes History: 0 Stroke History: 0 Vascular Disease History: 0 Age Score: 0 Gender Score: 0       ASSESSMENT AND PLAN: 1. Atrial flutter The patient's CHA2DS2-VASc score is 1, indicating a 0.6% annual risk of stroke.   General education about atrial flutter provided and questions answered.  Patient in SR today.  Check echocardiogram Resume Toprol 25 mg daily Will not start anticoagulation at this time given low CV score.   2. HTN Mildly elevated, resume BB as above.    Follow up in the AF clinic in one month.    06/21/22 PA-C Afib Clinic [facility] [address] [place], [place] [phone] 08/15/2022 3:27 PM

## 2022-08-22 ENCOUNTER — Other Ambulatory Visit (HOSPITAL_COMMUNITY): Payer: Commercial Managed Care - HMO

## 2022-08-22 ENCOUNTER — Ambulatory Visit (HOSPITAL_COMMUNITY)
Admission: RE | Admit: 2022-08-22 | Discharge: 2022-08-22 | Disposition: A | Discharge status: Home / Self Care | Payer: Commercial Managed Care - HMO | Source: Ambulatory Visit | Attending: Physician Assistant | Admitting: Physician Assistant

## 2022-08-22 DIAGNOSIS — I484 Atypical atrial flutter: Secondary | ICD-10-CM

## 2022-08-22 LAB — ECHOCARDIOGRAM COMPLETE
AR max vel: 3.03 cm2
AV Peak grad: 5.9 mmHg
Ao pk vel: 1.22 m/s
Area-P 1/2: 3.68 cm2
S' Lateral: 2.2 cm

## 2022-08-27 ENCOUNTER — Ambulatory Visit: Payer: Commercial Managed Care - HMO | Admitting: Pharmacist

## 2022-09-11 ENCOUNTER — Encounter: Payer: Self-pay | Admitting: Student

## 2022-09-14 IMAGING — CR DG CHEST 2V
2 series · 2 of 2 positions shown · non-contrast
Comparison: 08/31/2019.

CLINICAL DATA: Congestion/cough.

EXAM:
CHEST - 2 VIEW

[w chest pa]
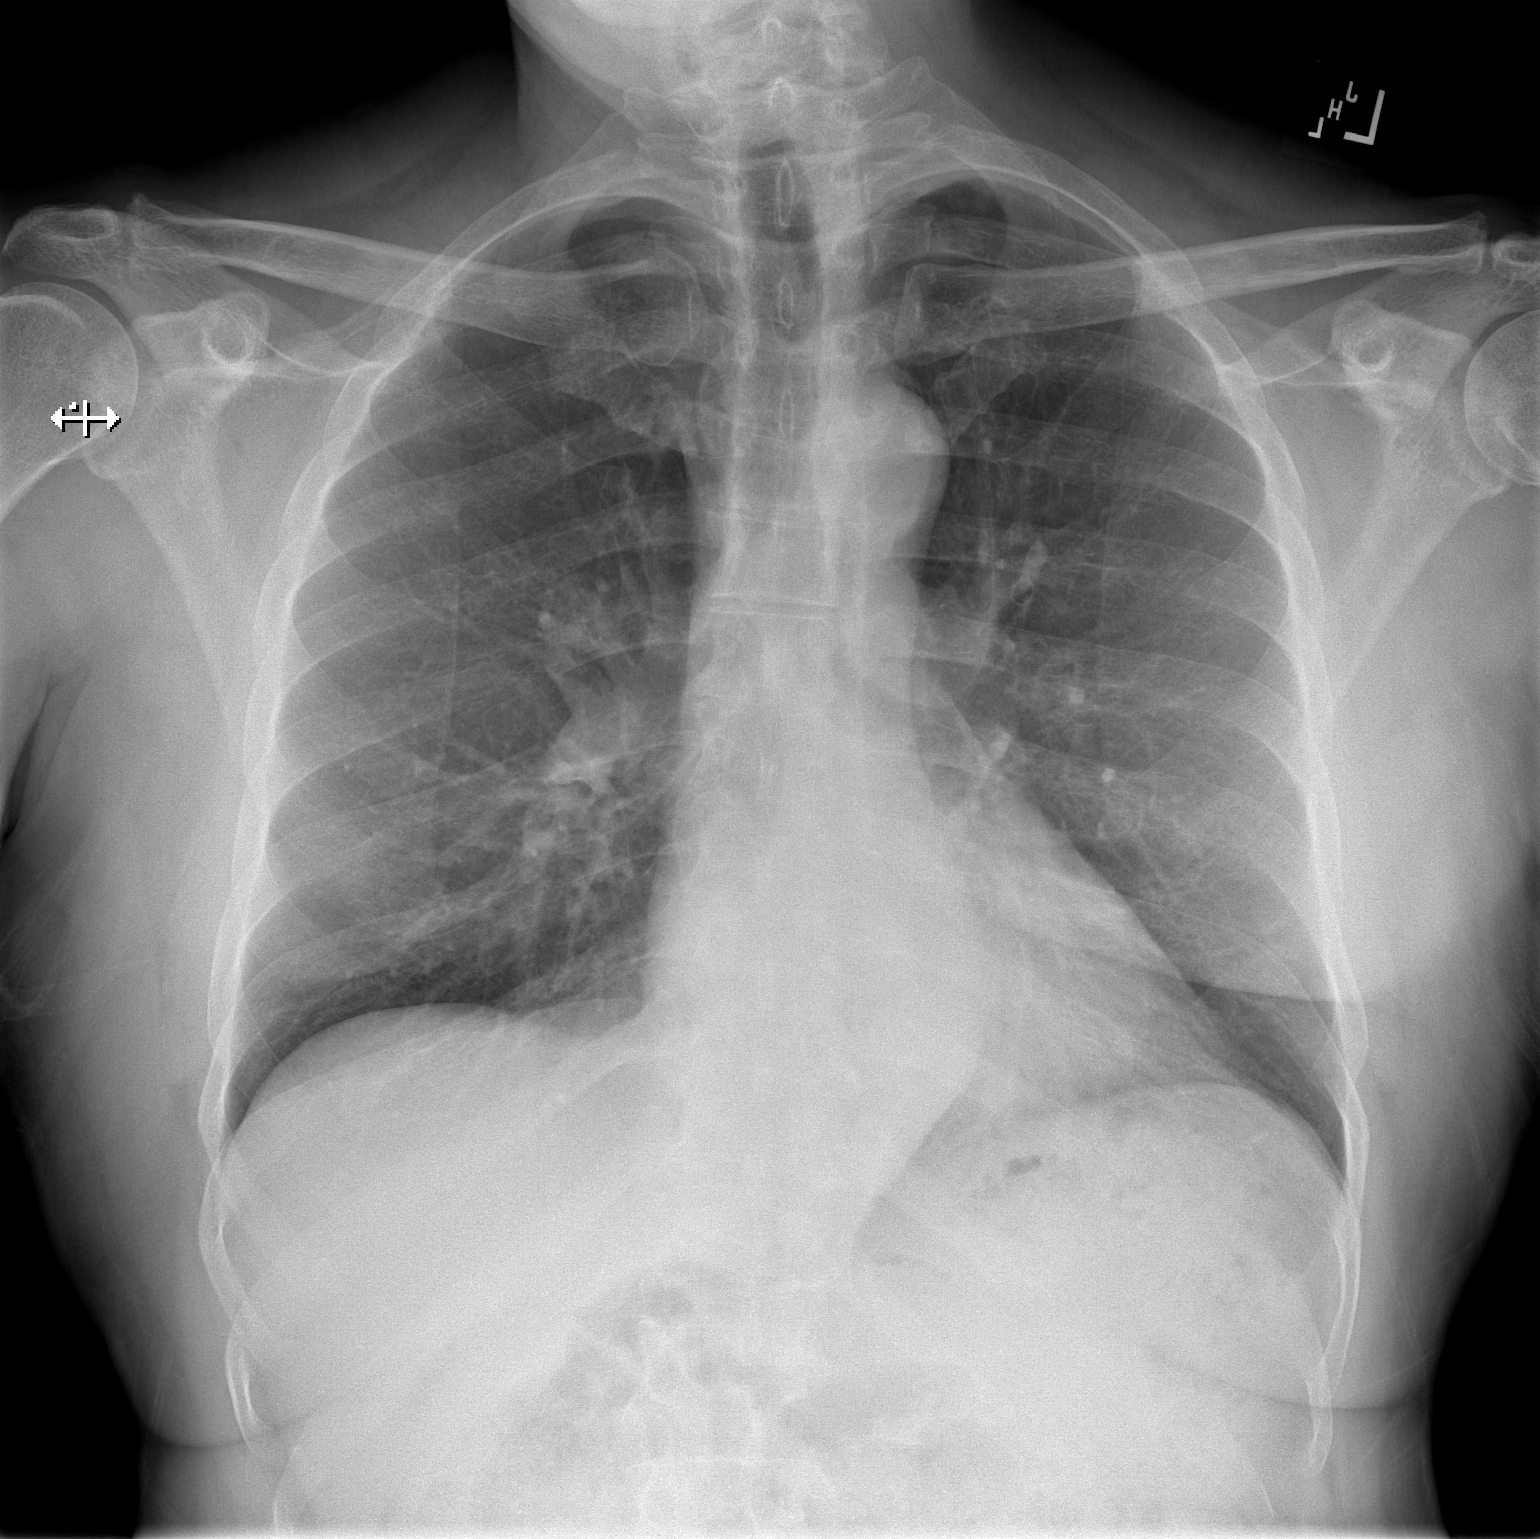

[w chest lat]
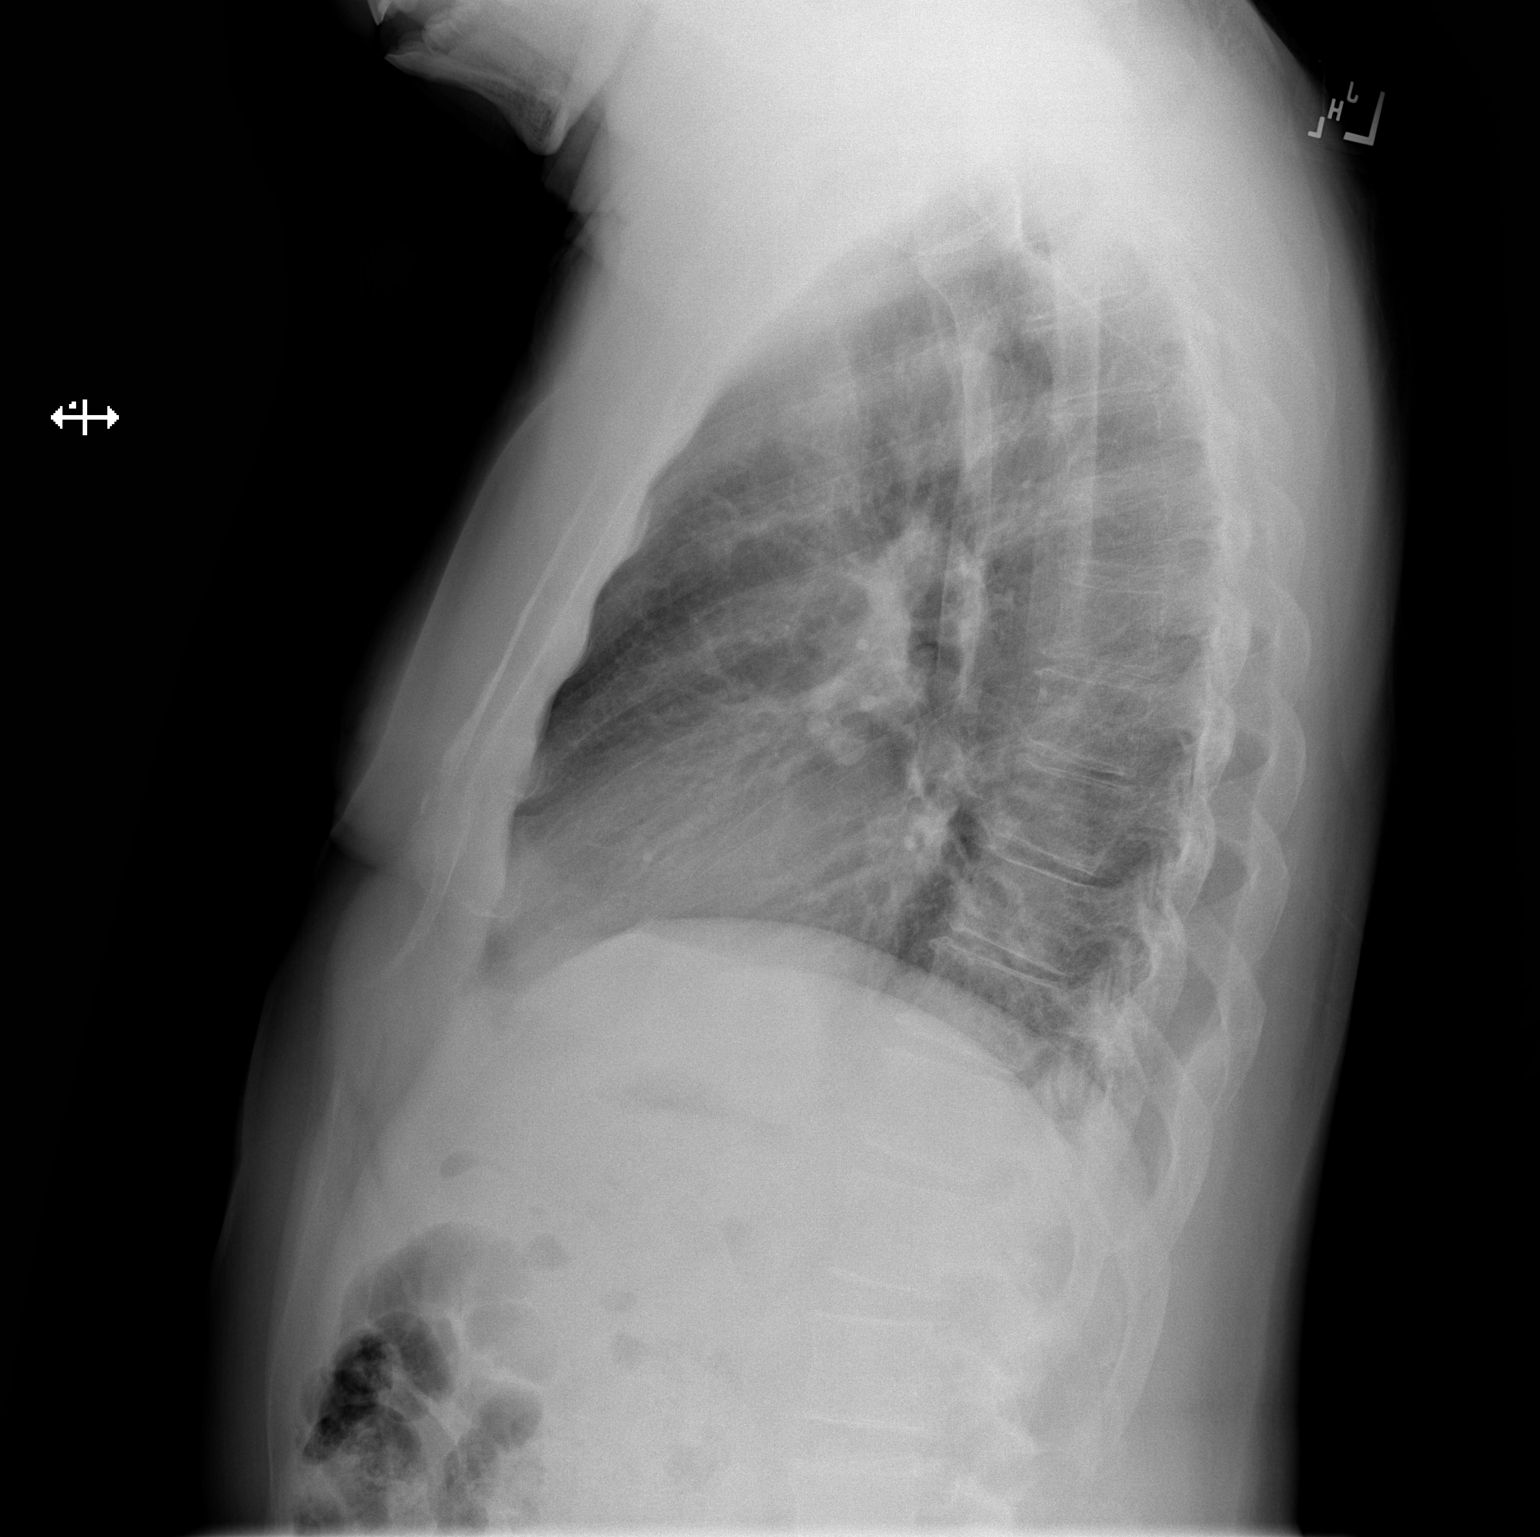

[2 of 2 positions shown; findings below may reference images not displayed]

FINDINGS: The heart size and mediastinal contours are within normal limits. No
consolidation, effusion, or pneumothorax. No acute osseous
abnormality.
IMPRESSION: No acute cardiopulmonary process.

## 2022-09-17 ENCOUNTER — Ambulatory Visit (HOSPITAL_COMMUNITY)
Admission: RE | Admit: 2022-09-17 | Discharge: 2022-09-17 | Disposition: A | Discharge status: Home / Self Care | Payer: Medicaid Other | Source: Ambulatory Visit | Attending: Physician Assistant | Admitting: Physician Assistant

## 2022-09-17 ENCOUNTER — Inpatient Hospital Stay (HOSPITAL_COMMUNITY)
Admission: RE | Admit: 2022-09-17 | Payer: Commercial Managed Care - HMO | Source: Ambulatory Visit | Admitting: Physician Assistant

## 2022-09-17 ENCOUNTER — Encounter (HOSPITAL_COMMUNITY): Payer: Self-pay | Admitting: Physician Assistant

## 2022-09-17 VITALS — BP 136/90 | HR 62 | Ht 68.0 in | Wt 190.2 lb

## 2022-09-17 DIAGNOSIS — I4892 Unspecified atrial flutter: Secondary | ICD-10-CM | POA: Insufficient documentation

## 2022-09-17 DIAGNOSIS — I484 Atypical atrial flutter: Secondary | ICD-10-CM

## 2022-09-17 DIAGNOSIS — I119 Hypertensive heart disease without heart failure: Secondary | ICD-10-CM | POA: Insufficient documentation

## 2022-09-17 DIAGNOSIS — Z7901 Long term (current) use of anticoagulants: Secondary | ICD-10-CM | POA: Insufficient documentation

## 2022-09-17 NOTE — Progress Notes (Signed)
Primary Care Physician: Levin Erp, MD Primary Cardiologist: none Primary Electrophysiologist: none Referring Physician: [facility]   Ricardo [name] is a 61 y.o. male with a history of HTN and atrial flutter who presents for follow up in the [facility].  The patient was initially diagnosed with atrial flutter after presenting to the ED with nasal congestion and ear pain. He was treated for sinusitis and incidentally found to be in atrial flutter. He was started on metoprolol for rate control. Patient has a CHADS2VASC score of 1. Patient was unaware of his arrhythmia.  On follow up today, patient reports that he has done well since his last visit. He has not noticed any elevated heart rates on his BP machine at home. He remains in SR today.   Today, he denies symptoms of palpitations, chest pain, shortness of breath, orthopnea, PND, lower extremity edema, dizziness, presyncope, syncope, snoring, daytime somnolence, bleeding, or neurologic sequela. The patient is tolerating medications without difficulties and is otherwise without complaint today.    Atrial Fibrillation Risk Factors:  he does not have symptoms or diagnosis of sleep apnea. he does not have a history of rheumatic fever. he does not have a history of alcohol use. The patient does not have a history of early familial atrial fibrillation or other arrhythmias.  he has a BMI of Body mass index is 28.92 kg/m.Marland Kitchen Filed Weights   09/17/22 1559  Weight: 86.3 kg    Family History  Problem Relation Age of Onset   Hyperlipidemia Father    Colon cancer Neg Hx    Colon polyps Neg Hx    Esophageal cancer Neg Hx    Rectal cancer Neg Hx    Stomach cancer Neg Hx      Atrial Fibrillation Management history:  Previous antiarrhythmic drugs: none Previous cardioversions: none Previous ablations: none CHADS2VASC score: 1 Anticoagulation history: none   Past Medical History:  Diagnosis  Date   Allergy    Shingles    Sinus congestion    Past Surgical History:  Procedure Laterality Date   NO PAST SURGERIES      Current Outpatient Medications  Medication Sig Dispense Refill   albuterol (VENTOLIN HFA) 108 (90 Base) MCG/ACT inhaler Inhale 1-2 puffs into the lungs every 6 (six) hours as needed (cough). 8 g 0   aspirin 81 MG chewable tablet Chew 1 tablet (81 mg total) by mouth daily. 30 tablet 1   cetirizine (ZYRTEC ALLERGY) 10 MG tablet Take 1 tablet (10 mg total) by mouth daily. 14 tablet 1   COD LIVER OIL PO Take by mouth daily.     losartan (COZAAR) 50 MG tablet Take 1 tablet by mouth once daily 30 tablet 0   metoprolol succinate (TOPROL-XL) 25 MG 24 hr tablet Take 1 tablet (25 mg total) by mouth daily. 30 tablet 3   Multiple Vitamin (MULTIVITAMIN) tablet Take 1 tablet by mouth daily.     naproxen sodium (ALEVE) 220 MG tablet Take 220 mg by mouth daily as needed (pain).     Omega-3 Fatty Acids (FISH OIL) 1000 MG CAPS Take by mouth.     fluticasone (FLONASE) 50 MCG/ACT nasal spray Place 2 sprays into both nostrils daily for 14 days. 11.1 mL 0   No current facility-administered medications for this encounter.    No Known Allergies  Social History   Socioeconomic History   Marital status: Married    Spouse name: Not on file   Number  of children: Not on file   Years of education: Not on file   Highest education level: Not on file  Occupational History   Not on file  Tobacco Use   Smoking status: Never   Smokeless tobacco: Never   Tobacco comments:    Never smoke 08/15/22  Vaping Use   Vaping Use: Never used  Substance and Sexual Activity   Alcohol use: No   Drug use: No   Sexual activity: Yes  Other Topics Concern   Not on file  Social History Narrative   Not on file   Social Determinants of Health   Financial Resource Strain: Not on file  Food Insecurity: Not on file  Transportation Needs: Not on file  Physical Activity: Not on file  Stress: Not  on file  Social Connections: Not on file  Intimate Partner Violence: Not on file     ROS- All systems are reviewed and negative except as per the HPI above.  Physical Exam: Vitals:   09/17/22 1559  BP: (!) 136/90  Pulse: 62  Weight: 86.3 kg  Height: 5\' 8"  (1.727 m)     GEN- The patient is a well appearing male, alert and oriented x 3 today.   HEENT-head normocephalic, atraumatic, sclera clear, conjunctiva pink, hearing intact, trachea midline. Lungs- Clear to ausculation bilaterally, normal work of breathing Heart- Regular rate and rhythm, no murmurs, rubs or gallops  GI- soft, NT, ND, + BS Extremities- no clubbing, cyanosis, or edema MS- no significant deformity or atrophy Skin- no rash or lesion Psych- euthymic mood, full affect Neuro- strength and sensation are intact   Wt Readings from Last 3 Encounters:  09/17/22 86.3 kg  08/15/22 86.6 kg  07/20/22 84.8 kg    EKG today demonstrates  SR Vent. rate 62 BPM PR interval 188 ms QRS duration 90 ms QT/QTcB 392/397 ms  Echo 08/22/22  1. Left ventricular ejection fraction, by estimation, is 60 to 65%. The  left ventricle has normal function. The left ventricle has no regional  wall motion abnormalities. Left ventricular diastolic parameters were  normal.   2. Right ventricular systolic function is normal. The right ventricular  size is normal. Tricuspid regurgitation signal is inadequate for assessing PA pressure.   3. The mitral valve is normal in structure. No evidence of mitral valve  regurgitation. No evidence of mitral stenosis.   4. The aortic valve was not well visualized. Aortic valve regurgitation  is not visualized. No aortic stenosis is present.   5. The inferior vena cava is normal in size with greater than 50%  respiratory variability, suggesting right atrial pressure of 3 mmHg.    Epic records are reviewed at length today  CHA2DS2-VASc Score = 1  The patient's score is based upon: CHF History:  0 HTN History: 1 Diabetes History: 0 Stroke History: 0 Vascular Disease History: 0 Age Score: 0 Gender Score: 0       ASSESSMENT AND PLAN: 1. Atrial flutter The patient's CHA2DS2-VASc score is 1, indicating a 0.6% annual risk of stroke.   Patient appears to be maintaining SR.  Patient will continue to monitor heart rates at home.  Continue Toprol 25 mg daily Not currently on anticoagulation given low CV score.   2. HTN Stable, no changes today.   Will refer to establish care with primary cardiologist. AF clinic as needed.    [facility] [address] [place], [place] [address] [phone] 09/17/2022 4:14 PM

## 2022-11-09 ENCOUNTER — Other Ambulatory Visit (HOSPITAL_COMMUNITY): Payer: Self-pay | Admitting: *Deleted

## 2022-11-09 MED ORDER — METOPROLOL SUCCINATE ER 25 MG PO TB24
25.0000 mg | ORAL_TABLET | Freq: Every day | ORAL | 6 refills | Status: DC
Start: 2022-11-09 — End: 2024-06-11

## 2022-11-14 ENCOUNTER — Other Ambulatory Visit: Payer: Self-pay | Admitting: Student

## 2022-12-05 ENCOUNTER — Other Ambulatory Visit (HOSPITAL_COMMUNITY): Payer: Self-pay | Admitting: *Deleted

## 2022-12-05 DIAGNOSIS — I480 Paroxysmal atrial fibrillation: Secondary | ICD-10-CM

## 2022-12-14 ENCOUNTER — Other Ambulatory Visit: Payer: Self-pay | Admitting: Student

## 2022-12-31 ENCOUNTER — Ambulatory Visit: Payer: Medicaid Other | Admitting: Cardiovascular Disease

## 2023-01-13 ENCOUNTER — Encounter: Payer: Self-pay | Admitting: Cardiovascular Disease

## 2023-01-13 NOTE — Progress Notes (Signed)
Cardiology Office Note:    Date:  01/16/2023   ID:  Ricardo [name], DOB [birth year], MRN [id number]  PCP:  Gerrit Heck, [place] Prov[facility] Cardiologist:  New to [facility]  Click to update primary MD,subspecialty MD or APP then REFRESH:1}    Referring MD: Oliver Barre, PA   Chief Complaint  Patient presents with   Atrial Fibrillation          History of Present Illness: Feb. 14, 2024   Ricardo [name] is a 62 y.o. male with a hx of atrial fib .  62 yo with hx of HTN, atrial flutter Has been seen in the atrial fib clinic  Has been started on metoprolol  CHADS2VASC is 1 ( HTN)  Has been asymptomatic  Echo shows normal LV systolic function   The dx of atrial fib was made incidentally on 07/20/22 when he presented to the ER with sinus congestion and was incidentally found to have atrial fib  Has been in sinus rhythm since that time Is not on anticoagulation  Hx of HTN  CHADS2VASC is 1 ( HTN)   Exercises daily  Is not currently working  No CP , no dyspnea   Echocardiogram performed August 23, 2022 reveals normal left ventricular systolic function.  He has no significant valvular abnormalities.  No ETOH, no cocaine . Non smoker     Past Medical History:  Diagnosis Date   Allergy    Shingles    Sinus congestion     Past Surgical History:  Procedure Laterality Date   NO PAST SURGERIES      Current Medications: Current Meds  Medication Sig   albuterol (VENTOLIN HFA) 108 (90 Base) MCG/ACT inhaler Inhale 1-2 puffs into the lungs every 6 (six) hours as needed (cough).   aspirin 81 MG chewable tablet Chew 1 tablet (81 mg total) by mouth daily.   cetirizine (ZYRTEC ALLERGY) 10 MG tablet Take 1 tablet (10 mg total) by mouth daily.   COD LIVER OIL PO Take by mouth daily.   fluticasone (FLONASE) 50 MCG/ACT nasal spray Place 2 sprays into both nostrils daily for 14 days.   losartan (COZAAR) 50 MG tablet Take 1 tablet by mouth once daily    metoprolol succinate (TOPROL-XL) 25 MG 24 hr tablet Take 1 tablet (25 mg total) by mouth daily.   Multiple Vitamin (MULTIVITAMIN) tablet Take 1 tablet by mouth daily.   naproxen sodium (ALEVE) 220 MG tablet Take 220 mg by mouth daily as needed (pain).   Omega-3 Fatty Acids (FISH OIL) 1000 MG CAPS Take by mouth.     Allergies:   Patient has no known allergies.   Social History   Socioeconomic History   Marital status: Married    Spouse name: Not on file   Number of children: Not on file   Years of education: Not on file   Highest education level: Not on file  Occupational History   Not on file  Tobacco Use   Smoking status: Never   Smokeless tobacco: Never   Tobacco comments:    Never smoke 08/15/22  Vaping Use   Vaping Use: Never used  Substance and Sexual Activity   Alcohol use: No   Drug use: No   Sexual activity: Yes  Other Topics Concern   Not on file  Social History Narrative   Not on file   Social Determinants of Health   Financial Resource Strain: Not on file  Food Insecurity: Not  on file  Transportation Needs: Not on file  Physical Activity: Not on file  Stress: Not on file  Social Connections: Not on file     Family History: The patient's family history includes Hyperlipidemia in his father. There is no history of Colon cancer, Colon polyps, Esophageal cancer, Rectal cancer, or Stomach cancer.  ROS:   Please see the history of present illness.     All other systems reviewed and are negative.  EKGs/Labs/Other Studies Reviewed:    The following studies were reviewed today:   EKG:    Recent Labs: 07/20/2022: BUN 15; Creatinine, Ser 1.06; Hemoglobin 15.3; Magnesium 2.4; Platelets 190; Potassium 3.7; Sodium 142  Recent Lipid Panel    Component Value Date/Time   CHOL 161 05/14/2022 1633   TRIG 48 05/14/2022 1633   HDL 54 05/14/2022 1633   CHOLHDL 3.0 05/14/2022 1633   LDLCALC 97 05/14/2022 1633     Risk Assessment/Calculations:                 Physical Exam:    VS:  BP 136/89   Pulse 75   Ht 5' 8"$  (1.727 m)   Wt 185 lb 9.6 oz (84.2 kg)   SpO2 98%   BMI 28.22 kg/m     Wt Readings from Last 3 Encounters:  01/16/23 185 lb 9.6 oz (84.2 kg)  09/17/22 190 lb 3.2 oz (86.3 kg)  08/15/22 191 lb (86.6 kg)     GEN:  Well nourished, well developed in no acute distress HEENT: Normal NECK: No JVD; No carotid bruits LYMPHATICS: No lymphadenopathy CARDIAC: RRR, no murmurs, rubs, gallops RESPIRATORY:  Clear to auscultation without rales, wheezing or rhonchi  ABDOMEN: Soft, non-tender, non-distended MUSCULOSKELETAL:  No edema; No deformity  SKIN: Warm and dry NEUROLOGIC:  Alert and oriented x 3 PSYCHIATRIC:  Normal affect   ASSESSMENT:    1. PAF (paroxysmal atrial fibrillation) (HCC)    PLAN:     Paroxysmal atrial fibrillation: Ricardo [name] remains in sinus rhythm today.  His last episode of atrial fibrillation was when he was originally diagnosed in August, 2023.  At that time he presented for further evaluation of a sinus infection and was incidentally found to have A-fib with rapid rate.  Since that time he has been on metoprolol.  He is maintaining sinus rhythm.  Continue current medications.  Will have him see Richardson Dopp, PA in 6 months for follow-up office visit.   2.  Hypertension: Blood pressure is at the upper limit of normal.  I encouraged him to watch his salt and to limit his fast foods and takeout foods.           Medication Adjustments/Labs and Tests Ordered: Current medicines are reviewed at length with the patient today.  Concerns regarding medicines are outlined above.  Orders Placed This Encounter  Procedures   Basic metabolic panel   No orders of the defined types were placed in this encounter.   Patient Instructions  Medication Instructions:  Your physician recommends that you continue on your current medications as directed. Please refer to the Current Medication list given to you  today.  *If you need a refill on your cardiac medications before your next appointment, please call your pharmacy*   Lab Work: BMET today  If you have labs (blood work) drawn today and your tests are completely normal, you will receive your results only by: [place] (if you have MyChart) OR A paper copy in the mail If you have any  lab test that is abnormal or we need to change your treatment, we will call you to review the results.   Testing/Procedures: NONE   Follow-Up: At [facility], you and your health needs are our priority.  As part of our continuing mission to provide you with exceptional heart care, we have created designated Provider Care Teams.  These Care Teams include your primary Cardiologist (physician) and Advanced Practice Providers (APPs -  Physician Assistants and Nurse Practitioners) who all work together to provide you with the care you need, when you need it.  We recommend signing up for the patient portal called "MyChart".  Sign up information is provided on this After Visit Summary.  MyChart is used to connect with patients for Virtual Visits (Telemedicine).  Patients are able to view lab/test results, encounter notes, upcoming appointments, etc.  Non-urgent messages can be sent to your provider as well.   To learn more about what you can do with MyChart, go to [email or web address].    Your next appointment:   6 month(s)  Provider:   Richardson Dopp, PA       Signed, Mertie Moores, MD  01/16/2023 4:12 PM    [place]

## 2023-01-16 ENCOUNTER — Ambulatory Visit: Payer: Medicaid Other | Attending: Cardiovascular Disease | Admitting: Cardiovascular Disease

## 2023-01-16 ENCOUNTER — Encounter: Payer: Self-pay | Admitting: Cardiovascular Disease

## 2023-01-16 VITALS — BP 136/89 | HR 75 | Ht 68.0 in | Wt 185.6 lb

## 2023-01-16 DIAGNOSIS — I480 Paroxysmal atrial fibrillation: Secondary | ICD-10-CM

## 2023-01-16 NOTE — Patient Instructions (Signed)
Medication Instructions:  Your physician recommends that you continue on your current medications as directed. Please refer to the Current Medication list given to you today.  *If you need a refill on your cardiac medications before your next appointment, please call your pharmacy*   Lab Work: BMET today  If you have labs (blood work) drawn today and your tests are completely normal, you will receive your results only by: [place] (if you have MyChart) OR A paper copy in the mail If you have any lab test that is abnormal or we need to change your treatment, we will call you to review the results.   Testing/Procedures: NONE   Follow-Up: At [facility], you and your health needs are our priority.  As part of our continuing mission to provide you with exceptional heart care, we have created designated Provider Care Teams.  These Care Teams include your primary Cardiologist (physician) and Advanced Practice Providers (APPs -  Physician Assistants and Nurse Practitioners) who all work together to provide you with the care you need, when you need it.  We recommend signing up for the patient portal called "MyChart".  Sign up information is provided on this After Visit Summary.  MyChart is used to connect with patients for Virtual Visits (Telemedicine).  Patients are able to view lab/test results, encounter notes, upcoming appointments, etc.  Non-urgent messages can be sent to your provider as well.   To learn more about what you can do with MyChart, go to [email or web address].    Your next appointment:   6 month(s)  Provider:   Richardson Dopp, PA

## 2023-01-17 LAB — BASIC METABOLIC PANEL
BUN/Creatinine Ratio: 9 — ABNORMAL LOW (ref 10–24)
BUN: 8 mg/dL (ref 8–27)
CO2: 25 mmol/L (ref 20–29)
Calcium: 9.6 mg/dL (ref 8.6–10.2)
Chloride: 105 mmol/L (ref 96–106)
Creatinine, Ser: 0.93 mg/dL (ref 0.76–1.27)
Glucose: 90 mg/dL (ref 70–99)
Potassium: 4.3 mmol/L (ref 3.5–5.2)
Sodium: 141 mmol/L (ref 134–144)
eGFR: 93 mL/min/{1.73_m2} (ref >59)

## 2023-03-20 ENCOUNTER — Other Ambulatory Visit: Payer: Self-pay | Admitting: Student

## 2023-07-29 ENCOUNTER — Ambulatory Visit: Payer: Medicaid Other | Admitting: Physician Assistant

## 2023-07-29 NOTE — Progress Notes (Deleted)
Cardiology Office Note:    Date:  07/29/2023  ID:  Ricardo [name], DOB 1961-09-11, MRN [id number] PCP: Levin Erp, MD  [facility] Providers Cardiologist:  None { Click to update primary MD,subspecialty MD or APP then REFRESH:1}    {Click to Open Review  :1}   Patient Profile:      Atrial fibrillation/flutter Incidental finding during ER visit for sinusitis in 07/2022 TTE 08/22/2022: EF 60-65, no RWMA, normal RVSF, RAP 3 Low stroke risk>> no anticoagulation Hypertension      {      :1}     Discussed the use of AI scribe software for clinical note transcription with the patient, who gave verbal consent to proceed.  History of Present Illness          ROS: ***    Studies Reviewed:       *** Risk Assessment/Calculations:   {Does this patient have ATRIAL FIBRILLATION?:[phone]} No BP recorded.  {Refresh Note OR Click here to enter BP  :1}***       Physical Exam:   VS:  There were no vitals taken for this visit.   Wt Readings from Last 3 Encounters:  01/16/23 185 lb 9.6 oz (84.2 kg)  09/17/22 190 lb 3.2 oz (86.3 kg)  08/15/22 191 lb (86.6 kg)    Physical Exam***     Assessment and Plan:  No problem-specific Assessment & Plan notes found for this encounter. Assessment and Plan            {      :1}    {Are you ordering a CV Procedure (e.g. stress test, cath, DCCV, TEE, etc)?   Press F2        :[id number]}  Dispo:  No follow-ups on file.  Signed, Tereso Newcomer, PA-C

## 2023-08-09 ENCOUNTER — Encounter (HOSPITAL_COMMUNITY): Payer: Self-pay | Admitting: Emergency Medicine

## 2023-08-09 ENCOUNTER — Other Ambulatory Visit: Payer: Self-pay

## 2023-08-09 ENCOUNTER — Emergency Department (HOSPITAL_COMMUNITY)
Admission: EM | Admit: 2023-08-09 | Discharge: 2023-08-09 | Disposition: A | Discharge status: Home / Self Care | Payer: Medicaid Other | Attending: Emergency Medicine | Admitting: Emergency Medicine

## 2023-08-09 DIAGNOSIS — J301 Allergic rhinitis due to pollen: Secondary | ICD-10-CM | POA: Diagnosis not present

## 2023-08-09 DIAGNOSIS — Z7982 Long term (current) use of aspirin: Secondary | ICD-10-CM | POA: Diagnosis not present

## 2023-08-09 DIAGNOSIS — R0981 Nasal congestion: Secondary | ICD-10-CM | POA: Diagnosis present

## 2023-08-09 DIAGNOSIS — Z1152 Encounter for screening for COVID-19: Secondary | ICD-10-CM | POA: Insufficient documentation

## 2023-08-09 DIAGNOSIS — R000 Tachycardia, unspecified: Secondary | ICD-10-CM | POA: Insufficient documentation

## 2023-08-09 LAB — SARS CORONAVIRUS 2 BY RT PCR: SARS Coronavirus 2 by RT PCR: NEGATIVE

## 2023-08-09 MED ORDER — PREDNISONE 20 MG PO TABS
40.0000 mg | ORAL_TABLET | Freq: Once | ORAL | Status: AC
  Administered 2023-08-09: 40 mg via ORAL
  Filled 2023-08-09: qty 2

## 2023-08-09 MED ORDER — PREDNISONE 20 MG PO TABS: 40.0000 mg | ORAL_TABLET | Freq: Every day | ORAL | 0 refills | Status: DC

## 2023-08-09 NOTE — ED Provider Notes (Signed)
[place] EMERGENCY DEPARTMENT AT [facility] Provider Note   CSN: [id number] Arrival date & time: 08/09/23  1938     History  Chief Complaint  Patient presents with   Nasal Congestion    Ricardo [name] is a 62 y.o. male.  Patient is a 62 year old male presenting today with complaints of nasal congestion for the last month that is not improving.  He feels fullness in his ears and sometimes a drainage going down the back of his throat.  He has not had any fever but sometimes has nighttime cough.  He has been taking cetirizine, using Flonase which is not helping his symptoms.  He does not use Afrin.  The history is provided by the patient.       Home Medications Prior to Admission medications   Medication Sig Start Date End Date Taking? Authorizing Provider  predniSONE (DELTASONE) 20 MG tablet Take 2 tablets (40 mg total) by mouth daily. 08/09/23  Yes Lacey Dotson, Alphonzo Lemmings, MD  albuterol (VENTOLIN HFA) 108 (90 Base) MCG/ACT inhaler Inhale 1-2 puffs into the lungs every 6 (six) hours as needed (cough). 07/05/22   Levin Erp, MD  aspirin 81 MG chewable tablet Chew 1 tablet (81 mg total) by mouth daily. 07/20/22   Gailen Shelter, PA  cetirizine (ZYRTEC ALLERGY) 10 MG tablet Take 1 tablet (10 mg total) by mouth daily. 07/20/22   Solon Augusta S, PA  COD LIVER OIL PO Take by mouth daily.    [provider]  fluticasone (FLONASE) 50 MCG/ACT nasal spray Place 2 sprays into both nostrils daily for 14 days. 07/20/22   Gailen Shelter, PA  losartan (COZAAR) 50 MG tablet Take 1 tablet (50 mg total) by mouth daily. NEEDS PCP APPT BEFORE MORE REFILLS 03/21/23   Levin Erp, MD  metoprolol succinate (TOPROL-XL) 25 MG 24 hr tablet Take 1 tablet (25 mg total) by mouth daily. 11/09/22   Fenton, Clint R, PA  Multiple Vitamin (MULTIVITAMIN) tablet Take 1 tablet by mouth daily.    [provider]  naproxen sodium (ALEVE) 220 MG tablet Take 220 mg by mouth daily as needed  (pain).    [provider]  Omega-3 Fatty Acids (FISH OIL) 1000 MG CAPS Take by mouth.    [provider]      Allergies    Patient has no known allergies.    Review of Systems   Review of Systems  Physical Exam Updated Vital Signs BP (!) 126/93 (BP Location: Left Arm)   Pulse (!) 112   Temp 98.9 F (37.2 C) (Oral)   Resp 18   SpO2 100%  Physical Exam Vitals and nursing note reviewed.  Constitutional:      General: He is not in acute distress.    Appearance: He is well-developed.  HENT:     Head: Normocephalic and atraumatic.     Right Ear: Tympanic membrane normal.     Left Ear: Tympanic membrane normal.     Nose: Congestion present.     Right Turbinates: Enlarged and swollen.     Left Turbinates: Enlarged and swollen.     Right Sinus: No maxillary sinus tenderness or frontal sinus tenderness.     Left Sinus: No maxillary sinus tenderness or frontal sinus tenderness.  Eyes:     Conjunctiva/sclera: Conjunctivae normal.     Pupils: Pupils are equal, round, and reactive to light.  Cardiovascular:     Rate and Rhythm: Regular rhythm. Tachycardia present.  Heart sounds: No murmur heard. Pulmonary:     Effort: Pulmonary effort is normal. No respiratory distress.  Musculoskeletal:        General: No tenderness. Normal range of motion.     Cervical back: Normal range of motion and neck supple.  Skin:    General: Skin is warm and dry.     Findings: No erythema or rash.  Neurological:     Mental Status: He is alert and oriented to person, place, and time.  Psychiatric:        Behavior: Behavior normal.     ED Results / Procedures / Treatments   Labs (all labs ordered are listed, but only abnormal results are displayed) Labs Reviewed  SARS CORONAVIRUS 2 BY RT PCR    EKG None  Radiology No results found.  Procedures Procedures    Medications Ordered in ED Medications  predniSONE (DELTASONE) tablet 40 mg (has no administration in time  range)    ED Course/ Medical Decision Making/ A&P                                 Medical Decision Making Risk Prescription drug management.   Patient presenting today with with ongoing classic for allergic rhinitis.  Patient reports he has this issue all the time but this time of year is always the worst.  Symptoms have been present for a month.  However patient is not having symptoms concerning for a bacterial sinusitis.  He does not use Afrin.  He is on Flonase and cetirizine.  Well-appearing here and low suspicion for viral illness.  COVID was negative.  Will treat with prednisone and have him continue his cetirizine and Flonase.  Also given information for ENT if his sinus symptoms persist        Final Clinical Impression(s) / ED Diagnoses Final diagnoses:  Seasonal allergic rhinitis due to pollen    Rx / DC Orders ED Discharge Orders          Ordered    predniSONE (DELTASONE) 20 MG tablet  Daily        08/09/23 2226              Gwyneth Sprout, MD 08/09/23 2236

## 2023-08-09 NOTE — ED Triage Notes (Signed)
Pt reports nasal congestion & watery eyes for a month now.

## 2023-08-09 NOTE — Discharge Instructions (Addendum)
Continue to do the Flonase, cetirizine.  You could also try sinus rinses or Nettie pot.  The prednisone was sent to your pharmacy.

## 2023-08-29 ENCOUNTER — Telehealth: Payer: Self-pay

## 2023-08-29 DIAGNOSIS — Z1211 Encounter for screening for malignant neoplasm of colon: Secondary | ICD-10-CM

## 2023-08-29 NOTE — Telephone Encounter (Signed)
Patient calls nurse line requesting new referral for colonoscopy. Previous referral has expired.   He is requesting that referral be sent to [facility]   Please advise.   Veronda Prude, RN

## 2023-09-07 ENCOUNTER — Other Ambulatory Visit: Payer: Self-pay | Admitting: Student

## 2023-09-25 ENCOUNTER — Other Ambulatory Visit: Payer: Self-pay

## 2023-09-25 ENCOUNTER — Encounter (HOSPITAL_COMMUNITY): Payer: Self-pay

## 2023-09-25 ENCOUNTER — Emergency Department (HOSPITAL_COMMUNITY)
Admission: EM | Admit: 2023-09-25 | Discharge: 2023-09-26 | Disposition: A | Discharge status: Home / Self Care | Payer: Medicaid Other | Attending: Student | Admitting: Student

## 2023-09-25 DIAGNOSIS — J0100 Acute maxillary sinusitis, unspecified: Secondary | ICD-10-CM | POA: Diagnosis not present

## 2023-09-25 DIAGNOSIS — Z79899 Other long term (current) drug therapy: Secondary | ICD-10-CM | POA: Diagnosis not present

## 2023-09-25 DIAGNOSIS — Z7982 Long term (current) use of aspirin: Secondary | ICD-10-CM | POA: Insufficient documentation

## 2023-09-25 DIAGNOSIS — H43393 Other vitreous opacities, bilateral: Secondary | ICD-10-CM | POA: Diagnosis present

## 2023-09-25 NOTE — ED Triage Notes (Addendum)
Patient report congestion and seeing flashes of light in his eye. Patient states he his appt with his PCP is next month and he is on the waiting list for an appt with the ophthalmologist. Patient states he has intermittent pain and is wearing glasses due to the sensitivity. Also states he has been sneezing and cough in some.   Denies changes in speech. Denies trauma or headache. Patient is able to ambulate independently.

## 2023-09-26 MED ORDER — AMOXICILLIN-POT CLAVULANATE 875-125 MG PO TABS: 1.0000 | ORAL_TABLET | Freq: Two times a day (BID) | ORAL | 0 refills | Status: DC

## 2023-09-26 NOTE — ED Provider Notes (Signed)
[place] EMERGENCY DEPARTMENT AT [facility] Provider Note   CSN: [id number] Arrival date & time: 09/25/23  2126     History  Chief Complaint  Patient presents with   Nasal Congestion   Eye Problem    Ricardo [name] is a 62 y.o. male.  Patient presents the emergency room complaining of persistent sinus infection which she states has been lasting for well over a month along with an eye complaint of seeing floaters for the past 3 weeks.  He states his visual acuity is grossly at baseline.  He has spoken with the ophthalmology office for an appointment and is post to speak to them again tomorrow.  He denies chest pain, shortness of breath, cough, abdominal pain, fever.  He denies pain in the eye, denies pain with extraocular movements.   Eye Problem      Home Medications Prior to Admission medications   Medication Sig Start Date End Date Taking? Authorizing Provider  amoxicillin-clavulanate (AUGMENTIN) 875-125 MG tablet Take 1 tablet by mouth every 12 (twelve) hours. 09/26/23  Yes Darrick Grinder, PA-C  albuterol (VENTOLIN HFA) 108 (90 Base) MCG/ACT inhaler Inhale 1-2 puffs into the lungs every 6 (six) hours as needed (cough). 07/05/22   Levin Erp, MD  aspirin 81 MG chewable tablet Chew 1 tablet (81 mg total) by mouth daily. 07/20/22   Gailen Shelter, PA  cetirizine (ZYRTEC ALLERGY) 10 MG tablet Take 1 tablet (10 mg total) by mouth daily. 07/20/22   Solon Augusta S, PA  COD LIVER OIL PO Take by mouth daily.    [provider]  fluticasone (FLONASE) 50 MCG/ACT nasal spray Place 2 sprays into both nostrils daily for 14 days. 07/20/22   Gailen Shelter, PA  losartan (COZAAR) 50 MG tablet TAKE 1 TABLET BY MOUTH ONCE DAILY. NEEDS PCP APPT BEFORE MORE REFILLS. 09/09/23   Levin Erp, MD  metoprolol succinate (TOPROL-XL) 25 MG 24 hr tablet Take 1 tablet (25 mg total) by mouth daily. 11/09/22   Fenton, Clint R, PA  Multiple Vitamin (MULTIVITAMIN) tablet Take  1 tablet by mouth daily.    [provider]  naproxen sodium (ALEVE) 220 MG tablet Take 220 mg by mouth daily as needed (pain).    [provider]  Omega-3 Fatty Acids (FISH OIL) 1000 MG CAPS Take by mouth.    [provider]  predniSONE (DELTASONE) 20 MG tablet Take 2 tablets (40 mg total) by mouth daily. 08/09/23   Gwyneth Sprout, MD      Allergies    Patient has no known allergies.    Review of Systems   Review of Systems  Physical Exam Updated Vital Signs BP 119/89   Pulse 70   Temp 99.5 F (37.5 C) (Oral)   Resp 18   Ht 5\' 8"  (1.727 m)   Wt 81.6 kg   SpO2 100%   BMI 27.37 kg/m  Physical Exam Vitals and nursing note reviewed.  HENT:     Head: Normocephalic and atraumatic.     Nose: Congestion present.     Right Nostril: No septal hematoma.     Left Nostril: No septal hematoma.     Right Turbinates: Swollen.     Left Turbinates: Swollen.     Right Sinus: Maxillary sinus tenderness present.     Left Sinus: Maxillary sinus tenderness present.  Eyes:     General: Vision grossly intact. Gaze aligned appropriately. No visual field deficit.    Extraocular Movements: Extraocular  movements intact.     Right eye: No nystagmus.     Left eye: No nystagmus.     Conjunctiva/sclera: Conjunctivae normal.     Right eye: Right conjunctiva is not injected. No chemosis, exudate or hemorrhage.    Left eye: Left conjunctiva is not injected. No chemosis, exudate or hemorrhage.    Pupils: Pupils are equal, round, and reactive to light.     Funduscopic exam:    Right eye: No hemorrhage, exudate or papilledema. Red reflex present.        Left eye: No hemorrhage, exudate or papilledema. Red reflex present. Pulmonary:     Effort: Pulmonary effort is normal. No respiratory distress.  Musculoskeletal:        General: No signs of injury.     Cervical back: Normal range of motion.  Skin:    General: Skin is dry.  Neurological:     Mental Status: He is alert.   Psychiatric:        Speech: Speech normal.        Behavior: Behavior normal.     ED Results / Procedures / Treatments   Labs (all labs ordered are listed, but only abnormal results are displayed) Labs Reviewed - No data to display  EKG None  Radiology No results found.  Procedures Procedures    Medications Ordered in ED Medications - No data to display  ED Course/ Medical Decision Making/ A&P                                 Medical Decision Making  This patient presents to the ED for concern of sinus pain and visual floaters, this involves an extensive number of treatment options, and is a complaint that carries with it a high risk of complications and morbidity.  The differential diagnosis includes sinus infection, viral respiratory infection, others.  Visual differentials include posterior vitreous detachment, retinal tear, retinal detachment, posterior uveitis, vitreous hemorrhage, sympathetic orthopnea, macular degeneration, migraines, others   Co morbidities that complicate the patient evaluation  Sinus congestion/seasonal allergies   Additional history obtained:  Additional history obtained from family bedside External records from outside source obtained and reviewed including ED notes   Test / Admission - Considered:  Visual acuity grossly normal.  No pain with extraocular movements.  Funduscopic exam grossly unremarkable.  Pain was not sudden in onset.  No description of "curtain falling" to suggest retinal detachment.  Patient states he has a follow-up call with ophthalmology in the morning.  Recommended patient continue plan with outpatient ophthalmology follow-up.  Based on history/length of symptoms and physical exam do not see any emergent condition that would require emergent ophthalmology consult tonight. Patient's sinus discomfort is consistent with bacterial sinusitis.  Will try course of antibiotics and have patient follow-up with primary  care.  Discharge home at this time.         Final Clinical Impression(s) / ED Diagnoses Final diagnoses:  Acute maxillary sinusitis, recurrence not specified  Visual floaters, bilateral    Rx / DC Orders ED Discharge Orders          Ordered    amoxicillin-clavulanate (AUGMENTIN) 875-125 MG tablet  Every 12 hours        09/26/23 0233              Darrick Grinder, PA-C 09/26/23 0233    Glendora Score, MD 09/26/23 [phone]

## 2023-09-26 NOTE — Discharge Instructions (Addendum)
I have prescribed antibiotics for your sinus symptoms.  I also recommend taking Flonase or other over-the-counter nasal steroid sprays.  Follow-up with primary care for further evaluation as needed. Please follow-up with ophthalmology further evaluation of your vision complaints.  Return to the emergency department if you develop any life-threatening symptoms

## 2023-10-16 ENCOUNTER — Encounter: Payer: Self-pay | Admitting: Cardiology

## 2023-10-16 ENCOUNTER — Ambulatory Visit: Payer: Medicaid Other | Attending: Physician Assistant | Admitting: Cardiology

## 2023-10-16 VITALS — BP 120/90 | HR 61 | Ht 68.0 in | Wt 187.0 lb

## 2023-10-16 DIAGNOSIS — I480 Paroxysmal atrial fibrillation: Secondary | ICD-10-CM

## 2023-10-16 DIAGNOSIS — I10 Essential (primary) hypertension: Secondary | ICD-10-CM

## 2023-10-16 NOTE — Progress Notes (Signed)
Cardiology Office Note:   Date:  10/16/2023  ID:  Ricardo [name], DOB 07/13/1961, MRN [id number] PCP: Levin Erp, MD  [facility] Providers Cardiologist:  Kristeen Miss, MD    History of Present Illness:   Discussed the use of AI scribe software for clinical note transcription with the patient, who gave verbal consent to proceed.  The patient is a 62 year old with a history of atrial fibrillation/flutter and hypertension.  Patient was diagnosed with atrial fibrillation/flutter in August 2023 during an emergency department visit for sinus congestion. He had spontaneous conversion to NSR at that time. Since the diagnosis, the patient has maintained sinus rhythm and has not been on OAC, given a CHADS2VASc score of 1. Metoprolol Succinate 25mg  was initiated in the atrial fibrillation clinic. An echocardiogram in September 2023 revealed a normal left ventricular ejection fraction of 60-65%, no regional wall motion abnormalities, normal right ventricular systolic function, and no significant valvular abnormalities.  The patient reports feeling well overall, with no new cardiac symptoms. He has had a few emergency department visits due to severe seasonal allergies, which cause eye irritation, nasal congestion, and coughing. He manages these symptoms with home remedies and allergy medication. The patient denies experiencing any palpitations, chest pain, shortness of breath, lightheadedness, dizziness, or leg swelling. He also denies any nocturnal symptoms such as shortness of breath or the need to elevate HOB.  The patient is currently on losartan for blood pressure control (managed by PCP), which he reports taking mostly regularly. He monitors his blood pressure at home and apparently self-adjusts his medication intake based on his readings (says he doesn't take Losartan if BP is in the lower 100s). He reports that his blood pressure is usually well-controlled. The patient reports regular  physicals with his primary care provider, with the most recent one earlier this year. He is due for another physical early next year. The patient denies being a heavy alcohol drinker and reports no significant snoring or symptoms suggestive of sleep apnea.  Today patient denies chest pain, shortness of breath, lower extremity edema, fatigue, palpitations, melena, hematuria, hemoptysis, diaphoresis, weakness, presyncope, syncope, orthopnea, and PND.   Studies Reviewed:    EKG:   EKG Interpretation Date/Time:  Wednesday October 16 2023 15:48:15 EST Ventricular Rate:  61 PR Interval:  186 QRS Duration:  86 QT Interval:  396 QTC Calculation: 398 R Axis:   -26  Text Interpretation: Normal sinus rhythm Possible Left atrial enlargement When compared with ECG of 17-Sep-2022 16:02, No significant change was found Confirmed by Perlie Gold [phone]) on 10/16/2023 3:49:54 PM    Cardiac Studies & Procedures       ECHOCARDIOGRAM  ECHOCARDIOGRAM COMPLETE 08/22/2022  Narrative ECHOCARDIOGRAM REPORT    Patient Name:   Ricardo [name] Date of Exam: 08/22/2022 Medical Rec #:  [id number]       Height:       68.0 in Accession #:    [id number]      Weight:       191.0 lb Date of Birth:  January 02, 1961        BSA:          2.004 m Patient Age:    61 years        BP:           151/101 mmHg Patient Gender: M               HR:           63 bpm. Exam  Location:  Outpatient  Procedure: 2D Echo, Cardiac Doppler and Color Doppler  Indications:    Atrial fibrillation  History:        Patient has no prior history of Echocardiogram examinations.  Sonographer:    Eduard Roux Referring Phys: [id number] CLINT R FENTON  IMPRESSIONS   1. Left ventricular ejection fraction, by estimation, is 60 to 65%. The left ventricle has normal function. The left ventricle has no regional wall motion abnormalities. Left ventricular diastolic parameters were normal. 2. Right ventricular systolic function is normal. The  right ventricular size is normal. Tricuspid regurgitation signal is inadequate for assessing PA pressure. 3. The mitral valve is normal in structure. No evidence of mitral valve regurgitation. No evidence of mitral stenosis. 4. The aortic valve was not well visualized. Aortic valve regurgitation is not visualized. No aortic stenosis is present. 5. The inferior vena cava is normal in size with greater than 50% respiratory variability, suggesting right atrial pressure of 3 mmHg.  FINDINGS Left Ventricle: Left ventricular ejection fraction, by estimation, is 60 to 65%. The left ventricle has normal function. The left ventricle has no regional wall motion abnormalities. The left ventricular internal cavity size was normal in size. There is no left ventricular hypertrophy. Left ventricular diastolic parameters were normal.  Right Ventricle: The right ventricular size is normal. No increase in right ventricular wall thickness. Right ventricular systolic function is normal. Tricuspid regurgitation signal is inadequate for assessing PA pressure.  Left Atrium: Left atrial size was normal in size.  Right Atrium: Right atrial size was normal in size.  Pericardium: There is no evidence of pericardial effusion.  Mitral Valve: The mitral valve is normal in structure. No evidence of mitral valve regurgitation. No evidence of mitral valve stenosis.  Tricuspid Valve: The tricuspid valve is normal in structure. Tricuspid valve regurgitation is trivial.  Aortic Valve: The aortic valve was not well visualized. Aortic valve regurgitation is not visualized. No aortic stenosis is present. Aortic valve peak gradient measures 5.9 mmHg.  Pulmonic Valve: The pulmonic valve was not well visualized. Pulmonic valve regurgitation is not visualized.  Aorta: The aortic root and ascending aorta are structurally normal, with no evidence of dilitation.  Venous: The inferior vena cava is normal in size with greater than 50%  respiratory variability, suggesting right atrial pressure of 3 mmHg.  IAS/Shunts: The interatrial septum was not well visualized.   LEFT VENTRICLE PLAX 2D LVIDd:         4.50 cm   Diastology LVIDs:         2.20 cm   LV e' medial:    9.79 cm/s LV PW:         1.00 cm   LV E/e' medial:  7.4 LV IVS:        0.80 cm   LV e' lateral:   11.60 cm/s LVOT diam:     2.00 cm   LV E/e' lateral: 6.3 LV SV:         76 LV SV Index:   38 LVOT Area:     3.14 cm   RIGHT VENTRICLE             IVC RV Basal diam:  3.00 cm     IVC diam: 1.70 cm RV S prime:     13.70 cm/s TAPSE (M-mode): 2.2 cm  LEFT ATRIUM             Index        RIGHT ATRIUM  Index LA diam:        3.80 cm 1.90 cm/m   RA Area:     16.50 cm LA Vol (A2C):   43.9 ml 21.90 ml/m  RA Volume:   43.20 ml  21.56 ml/m LA Vol (A4C):   39.7 ml 19.81 ml/m LA Biplane Vol: 43.0 ml 21.46 ml/m AORTIC VALVE                 PULMONIC VALVE AV Area (Vmax): 3.03 cm     PV Vmax:       0.79 m/s AV Vmax:        121.50 cm/s  PV Peak grad:  2.5 mmHg AV Peak Grad:   5.9 mmHg LVOT Vmax:      117.00 cm/s LVOT Vmean:     72.300 cm/s LVOT VTI:       0.241 m  AORTA Ao Root diam: 3.20 cm Ao Asc diam:  3.20 cm  MITRAL VALVE MV Area (PHT): 3.68 cm    SHUNTS MV Decel Time: 206 msec    Systemic VTI:  0.24 m MV E velocity: 72.80 cm/s  Systemic Diam: 2.00 cm MV A velocity: 65.00 cm/s MV E/A ratio:  1.12  Epifanio Lesches MD Electronically signed by Epifanio Lesches MD Signature Date/Time: 08/22/2022/6:59:41 PM    Final              Risk Assessment/Calculations:    CHA2DS2-VASc Score = 1   This indicates a 0.6% annual risk of stroke. The patient's score is based upon: CHF History: 0 HTN History: 1 Diabetes History: 0 Stroke History: 0 Vascular Disease History: 0 Age Score: 0 Gender Score: 0     Physical Exam:   VS:  BP (!) 120/90 (BP Location: Left Arm, Patient Position: Sitting, Cuff Size: Normal)   Pulse 61   Ht  5\' 8"  (1.727 m)   Wt 187 lb (84.8 kg)   BMI 28.43 kg/m    Wt Readings from Last 3 Encounters:  10/16/23 187 lb (84.8 kg)  09/25/23 180 lb (81.6 kg)  01/16/23 185 lb 9.6 oz (84.2 kg)     Physical Exam Vitals reviewed.  Constitutional:      Appearance: Normal appearance.  HENT:     Head: Normocephalic.  Eyes:     Pupils: Pupils are equal, round, and reactive to light.  Cardiovascular:     Rate and Rhythm: Normal rate and regular rhythm.     Pulses: Normal pulses.     Heart sounds: Normal heart sounds.  Pulmonary:     Effort: Pulmonary effort is normal.     Breath sounds: Normal breath sounds.  Musculoskeletal:     Right lower leg: No edema.     Left lower leg: No edema.  Skin:    General: Skin is warm and dry.     Capillary Refill: Capillary refill takes less than 2 seconds.  Neurological:     General: No focal deficit present.     Mental Status: He is alert and oriented to person, place, and time.  Psychiatric:        Mood and Affect: Mood normal.        Behavior: Behavior normal.        Thought Content: Thought content normal.        Judgment: Judgment normal.    ASSESSMENT AND PLAN:            Atrial Fibrillation Paroxysmal atrial fibrillation/flutter noted while in the ED in August 2023.  Incidental finding with patient asymptomatic at the time. No palpitations, rapid heart beat, chest pain to suggest recurrence. EKG today in clinic shows sinus rhythm. Per guidelines, patient's CHA2DS2-VASc score 1 does not indicated need for OAC.  - Continue metoprolol succinate 25 mg daily - Monitor for atrial fibrillation symptoms - Continue to reassess CHA2DS2-VASc  Hypertension Blood pressure well-controlled with losartan 50 mg and metoprolol succinate 25 mg. Today's BP is 120/90 mmHg. Discussed importance of consistent medication adherence. - Continue losartan 50 mg per PCP management - Monitor blood pressure regularly  General Health Maintenance Lipid panel in 2023  showed cholesterol at goal <100. Discussed importance of annual physical exams and routine screenings. - Schedule annual physical with primary care early next year - Continue to avoid alcohol to reduce atrial fibrillation risk - Monitor cholesterol levels with primary care  Follow up with Dr. Elease Hashimoto in 6 months.         Signed, Perlie Gold, PA-C

## 2023-10-16 NOTE — Patient Instructions (Signed)
Medication Instructions:  Your physician recommends that you continue on your current medications as directed. Please refer to the Current Medication list given to you today.  *If you need a refill on your cardiac medications before your next appointment, please call your pharmacy*  Lab Work: None ordered today  Testing/Procedures: None ordered today  Follow-Up: At [facility], you and your health needs are our priority.  As part of our continuing mission to provide you with exceptional heart care, we have created designated Provider Care Teams.  These Care Teams include your primary Cardiologist (physician) and Advanced Practice Providers (APPs -  Physician Assistants and Nurse Practitioners) who all work together to provide you with the care you need, when you need it.  Your next appointment:   6 month(s)  The format for your next appointment:   In Person  Provider:   Philip Nahser, MD    

## 2023-12-27 ENCOUNTER — Other Ambulatory Visit: Payer: Self-pay | Admitting: Student

## 2024-05-12 ENCOUNTER — Encounter: Payer: Self-pay | Admitting: *Deleted

## 2024-06-11 ENCOUNTER — Telehealth: Payer: Self-pay | Admitting: Cardiovascular Disease

## 2024-06-11 MED ORDER — METOPROLOL SUCCINATE ER 25 MG PO TB24: 25.0000 mg | ORAL_TABLET | Freq: Every day | ORAL | 1 refills | Status: AC

## 2024-06-11 NOTE — Telephone Encounter (Signed)
 Pt saw Artist Pouch PA-C back in Nov 2024 where he was told to continue his current regimen of Toprol  XL 25 mg po daily.  Pt will see Orren Fabry PA-C for follow-up on 8/27 at 1:30 pm.  Will send in a 30 day supply with 1 refill, to get him through to his next follow-up appt with Orren Fabry PA-C on 8/27.  Called the pt and made him aware of this. He agreed to this plan and was very appreciative for all the assistance provided.

## 2024-06-11 NOTE — Telephone Encounter (Signed)
 Former Pt of Dr. Calhoun. This RX has not been filled by [facility] from what I can see. It looks like [facility] has been filling it. Does any Provider want to refill? Please advise.

## 2024-06-11 NOTE — Telephone Encounter (Signed)
*  STAT* If patient is at the pharmacy, call can be transferred to refill team.   1. Which medications need to be refilled? (please list name of each medication and dose if known)   metoprolol  succinate (TOPROL -XL) 25 MG 24 hr tablet    2. Which pharmacy/location (including street and city if local pharmacy) is medication to be sent to?  [facility] -  ([place]),  - [address]     3. Do they need a 30 day or 90 day supply? 90 day    Pt is out of medication

## 2024-07-28 NOTE — Progress Notes (Deleted)
 Cardiology Office Note   Date:  07/28/2024  ID:  AMAHD [name], DOB [birth year], MRN [id number] PCP: Adele Song, MD  [facility] Providers Cardiologist:  Aleene Passe, MD (Inactive)   History of Present Illness Ricardo [name] is a 63 y.o. male with a past medical history of atrial fibrillation/flutter and hypertension here for follow-up appointment.  Patient was diagnosed with atrial flutter/fibrillation in August 2023 during an ER visit for sinus congestion.  Had spontaneous conversion to normal sinus rhythm at that time.  Since that diagnosis, patient has maintained sinus rhythm and has not been on a OAC given CHA2DS2-VASc score of 1.  Metoprolol  succinate 25 mg was initiated in A-fib clinic.  An echocardiogram in September 2022 revealed a normal left ventricular ejection fraction of 65%, no regional wall motion abnormalities, normal right ventricular systolic function and no significant valvular abnormalities.  The patient reported feeling overall well at the last visit without any new cardiovascular complaints.  He was last seen November 2024.  He had few emergency department visits due to severe seasonal allergies which caused eye irritation, nasal congestion, and coughing.  He manages the symptoms with help remedies and allergy medications.  Did not experience any palpitations, chest pain, SOB, lightheadedness, dizziness, leg swelling, and also denied any nocturnal symptoms of shortness of breath.  Patient was on losartan  for blood pressure control managed by PCP for which she reports taking mostly regularly.  He monitors his blood pressure at home and currently self adjust his medication intake based on his readings (says he does not take losartan  and BP is in the lower 100s).  Reports his blood pressure is usually well-controlled.  Reports regular primary care visits with 1 earlier this year.  Patient denies being a heavy alcohol drinker reports no symptoms prior symptoms  suggesting OSA.  Today, he***  ROS: Pertinent ROS in HPI  Studies Reviewed      ECHOCARDIOGRAM   ECHOCARDIOGRAM COMPLETE 08/22/2022   Narrative ECHOCARDIOGRAM REPORT       Patient Name:   Ricardo [name] Date of Exam: 08/22/2022 Medical Rec #:  [id number]       Height:       68.0 in Accession #:    [id number]      Weight:       191.0 lb Date of Birth:  May 04, 1961        BSA:          2.004 m Patient Age:    61 years        BP:           151/101 mmHg Patient Gender: M               HR:           63 bpm. Exam Location:  Outpatient   Procedure: 2D Echo, Cardiac Doppler and Color Doppler   Indications:    Atrial fibrillation   History:        Patient has no prior history of Echocardiogram examinations.   Sonographer:    Elida Casey Referring Phys: [id number] CLINT R FENTON   IMPRESSIONS     1. Left ventricular ejection fraction, by estimation, is 60 to 65%. The left ventricle has normal function. The left ventricle has no regional wall motion abnormalities. Left ventricular diastolic parameters were normal. 2. Right ventricular systolic function is normal. The right ventricular size is normal. Tricuspid regurgitation signal is inadequate for assessing PA pressure. 3. The mitral valve is normal  in structure. No evidence of mitral valve regurgitation. No evidence of mitral stenosis. 4. The aortic valve was not well visualized. Aortic valve regurgitation is not visualized. No aortic stenosis is present. 5. The inferior vena cava is normal in size with greater than 50% respiratory variability, suggesting right atrial pressure of 3 mmHg.   FINDINGS Left Ventricle: Left ventricular ejection fraction, by estimation, is 60 to 65%. The left ventricle has normal function. The left ventricle has no regional wall motion abnormalities. The left ventricular internal cavity size was normal in size. There is no left ventricular hypertrophy. Left ventricular diastolic parameters were normal.    Right Ventricle: The right ventricular size is normal. No increase in right ventricular wall thickness. Right ventricular systolic function is normal. Tricuspid regurgitation signal is inadequate for assessing PA pressure.   Left Atrium: Left atrial size was normal in size.   Right Atrium: Right atrial size was normal in size.   Pericardium: There is no evidence of pericardial effusion.   Mitral Valve: The mitral valve is normal in structure. No evidence of mitral valve regurgitation. No evidence of mitral valve stenosis.   Tricuspid Valve: The tricuspid valve is normal in structure. Tricuspid valve regurgitation is trivial.   Aortic Valve: The aortic valve was not well visualized. Aortic valve regurgitation is not visualized. No aortic stenosis is present. Aortic valve peak gradient measures 5.9 mmHg.   Pulmonic Valve: The pulmonic valve was not well visualized. Pulmonic valve regurgitation is not visualized.   Aorta: The aortic root and ascending aorta are structurally normal, with no evidence of dilitation.   Venous: The inferior vena cava is normal in size with greater than 50% respiratory variability, suggesting right atrial pressure of 3 mmHg.   IAS/Shunts: The interatrial septum was not well visualized.     LEFT VENTRICLE PLAX 2D LVIDd:         4.50 cm   Diastology LVIDs:         2.20 cm   LV e' medial:    9.79 cm/s LV PW:         1.00 cm   LV E/e' medial:  7.4 LV IVS:        0.80 cm   LV e' lateral:   11.60 cm/s LVOT diam:     2.00 cm   LV E/e' lateral: 6.3 LV SV:         76 LV SV Index:   38 LVOT Area:     3.14 cm     RIGHT VENTRICLE             IVC RV Basal diam:  3.00 cm     IVC diam: 1.70 cm RV S prime:     13.70 cm/s TAPSE (M-mode): 2.2 cm   LEFT ATRIUM             Index        RIGHT ATRIUM           Index LA diam:        3.80 cm 1.90 cm/m   RA Area:     16.50 cm LA Vol (A2C):   43.9 ml 21.90 ml/m  RA Volume:   43.20 ml  21.56 ml/m LA Vol (A4C):   39.7  ml 19.81 ml/m LA Biplane Vol: 43.0 ml 21.46 ml/m AORTIC VALVE                 PULMONIC VALVE AV Area (Vmax): 3.03 cm     PV  Vmax:       0.79 m/s AV Vmax:        121.50 cm/s  PV Peak grad:  2.5 mmHg AV Peak Grad:   5.9 mmHg LVOT Vmax:      117.00 cm/s LVOT Vmean:     72.300 cm/s LVOT VTI:       0.241 m   AORTA Ao Root diam: 3.20 cm Ao Asc diam:  3.20 cm   MITRAL VALVE MV Area (PHT): 3.68 cm    SHUNTS MV Decel Time: 206 msec    Systemic VTI:  0.24 m MV E velocity: 72.80 cm/s  Systemic Diam: 2.00 cm MV A velocity: 65.00 cm/s MV E/A ratio:  1.12   Lonni Nanas MD Electronically signed by Lonni Nanas MD Signature Date/Time: 08/22/2022/6:59:41 PM       Final  Risk Assessment/Calculations {Does this patient have ATRIAL FIBRILLATION?:[id number]} No BP recorded.  {Refresh Note OR Click here to enter BP  :1}***       Physical Exam VS:  There were no vitals taken for this visit.       Wt Readings from Last 3 Encounters:  10/16/23 187 lb (84.8 kg)  09/25/23 180 lb (81.6 kg)  01/16/23 185 lb 9.6 oz (84.2 kg)    GEN: Well nourished, well developed in no acute distress NECK: No JVD; No carotid bruits CARDIAC: ***RRR, no murmurs, rubs, gallops RESPIRATORY:  Clear to auscultation without rales, wheezing or rhonchi  ABDOMEN: Soft, non-tender, non-distended EXTREMITIES:  No edema; No deformity   ASSESSMENT AND PLAN Atrial fibrillation/flutter Hypertension     {Are you ordering a CV Procedure (e.g. stress test, cath, DCCV, TEE, etc)?   Press F2        :[id number]}  Dispo: ***  Signed, Orren LOISE Fabry, PA-C

## 2024-07-29 ENCOUNTER — Ambulatory Visit: Admitting: Physician Assistant

## 2024-07-29 DIAGNOSIS — I480 Paroxysmal atrial fibrillation: Secondary | ICD-10-CM

## 2024-07-29 DIAGNOSIS — I10 Essential (primary) hypertension: Secondary | ICD-10-CM

## 2024-07-29 DIAGNOSIS — I484 Atypical atrial flutter: Secondary | ICD-10-CM

## 2024-07-30 ENCOUNTER — Encounter (HOSPITAL_COMMUNITY): Payer: Self-pay

## 2024-07-30 ENCOUNTER — Emergency Department (HOSPITAL_COMMUNITY)
Admission: EM | Admit: 2024-07-30 | Discharge: 2024-07-30 | Disposition: A | Discharge status: Home / Self Care | Payer: Self-pay | Attending: Emergency Medicine | Admitting: Emergency Medicine

## 2024-07-30 ENCOUNTER — Other Ambulatory Visit: Payer: Self-pay

## 2024-07-30 DIAGNOSIS — I10 Essential (primary) hypertension: Secondary | ICD-10-CM | POA: Insufficient documentation

## 2024-07-30 DIAGNOSIS — Z7982 Long term (current) use of aspirin: Secondary | ICD-10-CM | POA: Insufficient documentation

## 2024-07-30 DIAGNOSIS — J301 Allergic rhinitis due to pollen: Secondary | ICD-10-CM | POA: Insufficient documentation

## 2024-07-30 DIAGNOSIS — J302 Other seasonal allergic rhinitis: Secondary | ICD-10-CM

## 2024-07-30 DIAGNOSIS — Z79899 Other long term (current) drug therapy: Secondary | ICD-10-CM | POA: Insufficient documentation

## 2024-07-30 LAB — RESP PANEL BY RT-PCR (RSV, FLU A&B, COVID)  RVPGX2
Influenza A by PCR: NEGATIVE
Influenza B by PCR: NEGATIVE
Resp Syncytial Virus by PCR: NEGATIVE
SARS Coronavirus 2 by RT PCR: NEGATIVE

## 2024-07-30 MED ORDER — PREDNISONE 20 MG PO TABS
60.0000 mg | ORAL_TABLET | Freq: Once | ORAL | Status: AC
  Administered 2024-07-30: 60 mg via ORAL
  Filled 2024-07-30: qty 3

## 2024-07-30 MED ORDER — FEXOFENADINE HCL 60 MG PO TABS: 60.0000 mg | ORAL_TABLET | Freq: Two times a day (BID) | ORAL | 1 refills | Status: AC

## 2024-07-30 MED ORDER — PREDNISONE 20 MG PO TABS: 40.0000 mg | ORAL_TABLET | Freq: Every day | ORAL | 0 refills | Status: DC

## 2024-07-30 MED ORDER — MOMETASONE FUROATE 50 MCG/ACT NA SUSP: 2.0000 | Freq: Every day | NASAL | 12 refills | Status: AC

## 2024-07-30 NOTE — ED Triage Notes (Signed)
 Pt presents to ED from home C/O nasal congestion, headache, body aches.

## 2024-07-30 NOTE — ED Provider Notes (Signed)
 [place] EMERGENCY DEPARTMENT AT [facility] Provider Note   CSN: [id number] Arrival date & time: 07/30/24  1506     Patient presents with: Nasal Congestion   Ricardo [name] is a 63 y.o. male.   Patient is a 63 year old male with a history of hypertension and seasonal allergies who is presenting today with complaint of ongoing nasal congestion, ear pain and facial pain that has been present for approximately 3 months.  He has tried Flonase  and Zyrtec  without any improvement.  He denies using any Afrin.  He has not had fever or significant cough.  The history is provided by the patient.       Prior to Admission medications   Medication Sig Start Date End Date Taking? Authorizing Provider  fexofenadine  (ALLEGRA ) 60 MG tablet Take 1 tablet (60 mg total) by mouth 2 (two) times daily. 07/30/24  Yes Doretha Folks, MD  mometasone  (NASONEX ) 50 MCG/ACT nasal spray Place 2 sprays into the nose daily. 07/30/24  Yes Doretha Folks, MD  predniSONE  (DELTASONE ) 20 MG tablet Take 2 tablets (40 mg total) by mouth daily. 07/30/24  Yes Doretha Folks, MD  albuterol  (VENTOLIN  HFA) 108 (90 Base) MCG/ACT inhaler Inhale 1-2 puffs into the lungs every 6 (six) hours as needed (cough). 07/05/22   Christia Budds, MD  amoxicillin -clavulanate (AUGMENTIN ) 875-125 MG tablet Take 1 tablet by mouth every 12 (twelve) hours. 09/26/23   Logan Ubaldo NOVAK, PA-C  aspirin  81 MG chewable tablet Chew 1 tablet (81 mg total) by mouth daily. 07/20/22   Neldon Inoue S, PA  COD LIVER OIL PO Take by mouth daily.    [provider]  losartan  (COZAAR ) 50 MG tablet TAKE 1 TABLET BY MOUTH ONCE DAILY. NEEDS PCP APPT BEFORE MORE REFILLS. 12/28/23   Christia Budds, MD  metoprolol  succinate (TOPROL -XL) 25 MG 24 hr tablet Take 1 tablet (25 mg total) by mouth daily. 06/11/24   Williams, Evan, PA-C  Multiple Vitamin (MULTIVITAMIN) tablet Take 1 tablet by mouth daily.    [provider]  naproxen   sodium (ALEVE ) 220 MG tablet Take 220 mg by mouth daily as needed (pain).    [provider]  Omega-3 Fatty Acids (FISH OIL) 1000 MG CAPS Take by mouth.    [provider]    Allergies: Patient has no known allergies.    Review of Systems  Updated Vital Signs BP (!) 142/105 (BP Location: Right Arm)   Pulse 75   Temp 98.4 F (36.9 C) (Oral)   Resp 16   Ht 5' 8 (1.727 m)   Wt 83.9 kg   SpO2 99%   BMI 28.13 kg/m   Physical Exam Vitals and nursing note reviewed.  Constitutional:      General: He is not in acute distress.    Appearance: He is well-developed.  HENT:     Head: Normocephalic and atraumatic.     Right Ear: Tympanic membrane normal.     Left Ear: Tympanic membrane normal.     Nose:     Right Turbinates: Enlarged and swollen.     Left Turbinates: Enlarged and swollen.     Right Sinus: Maxillary sinus tenderness present.     Left Sinus: Maxillary sinus tenderness present.  Eyes:     Conjunctiva/sclera: Conjunctivae normal.     Pupils: Pupils are equal, round, and reactive to light.  Cardiovascular:     Rate and Rhythm: Normal rate and regular rhythm.     Heart sounds: No murmur heard.  Pulmonary:     Effort: Pulmonary effort is normal. No respiratory distress.     Breath sounds: Normal breath sounds. No wheezing or rales.  Musculoskeletal:     Cervical back: Normal range of motion and neck supple.  Lymphadenopathy:     Cervical: No cervical adenopathy.  Skin:    General: Skin is warm.  Neurological:     Mental Status: He is alert and oriented to person, place, and time.  Psychiatric:        Behavior: Behavior normal.     (all labs ordered are listed, but only abnormal results are displayed) Labs Reviewed  RESP PANEL BY RT-PCR (RSV, FLU A&B, COVID)  RVPGX2    EKG: None  Radiology: No results found.   Procedures   Medications Ordered in the ED  predniSONE  (DELTASONE ) tablet 60 mg (has no administration in time range)                                     Medical Decision Making Risk Prescription drug management.   Patient presenting today with symptoms of ongoing seasonal allergies not improved with Zyrtec  or Flonase .  Low suspicion for acute infectious cause or a bacterial sinusitis.  COVID and flu are negative.  Patient is otherwise well-appearing.  He is hypertensive but reports he takes his blood pressure medication when he needs it.  He has no history of diabetes.  Will try Nasonex  and Allegra  but also a short course of steroids.     Final diagnoses:  Seasonal allergies  Allergic rhinitis due to pollen, unspecified seasonality    ED Discharge Orders          Ordered    fexofenadine  (ALLEGRA ) 60 MG tablet  2 times daily        07/30/24 1810    predniSONE  (DELTASONE ) 20 MG tablet  Daily        07/30/24 1810    mometasone  (NASONEX ) 50 MCG/ACT nasal spray  Daily        07/30/24 1810               Doretha Folks, MD 07/30/24 1810

## 2024-09-07 ENCOUNTER — Ambulatory Visit: Admitting: Physician Assistant

## 2024-09-14 ENCOUNTER — Other Ambulatory Visit: Payer: Self-pay

## 2024-09-14 MED ORDER — LOSARTAN POTASSIUM 50 MG PO TABS: 50.0000 mg | ORAL_TABLET | Freq: Every day | ORAL | 0 refills | Status: DC

## 2024-09-16 ENCOUNTER — Telehealth: Payer: Self-pay | Admitting: Family Medicine

## 2024-09-16 NOTE — Telephone Encounter (Signed)
 LVM for patient to schedule appointment for med refills.

## 2024-10-16 ENCOUNTER — Encounter: Payer: Self-pay | Admitting: *Deleted

## 2024-10-19 ENCOUNTER — Ambulatory Visit: Admitting: Emergency Medicine

## 2024-10-19 NOTE — Progress Notes (Deleted)
  Cardiology Office Note:    Date:  10/19/2024  ID:  Ricardo [name], DOB February 02, 1961, MRN [id number] PCP: Adele Song, MD  [facility] Providers Cardiologist:  Aleene Passe, MD (Inactive) { Click to update primary MD,subspecialty MD or APP then REFRESH:1}    {Click to Open Review  :1}   Patient Profile:       Chief Complaint: *** History of Present Illness:  Ricardo [name] is a 63 y.o. male with visit-pertinent history of atrial fibrillation/flutter and hypertension  He was diagnosed with atrial fibrillation/flutter in August 2023 during an emergency department visit for sinus congestion.  He had spontaneous conversion to NSR at that time.  Since his diagnosis he had maintained sinus rhythm and had not been on anticoagulation given a CHADS2 Vascor of 1.  Metoprolol  succinate was initiated by the atrial fibrillation clinic.  He did undergo echocardiogram in September 2023 revealing normal LVEF of 60 to 65%, no RWMA, normal RV and no significant valvular abnormalities.  He was last seen in clinic on 10/16/2023.  He is doing well with no new cardiac symptoms.  EKG in clinic showed normal sinus rhythm.  No medication changes were made.  Follow-up in 6 months.  Discussed the use of AI scribe software for clinical note transcription with the patient, who gave verbal consent to proceed.  History of Present Illness     Review of systems:  Please see the history of present illness. All other systems are reviewed and otherwise negative. ***      Studies Reviewed:        ***  Risk Assessment/Calculations:   {Does this patient have ATRIAL FIBRILLATION?:[phone]} No BP recorded.  {Refresh Note OR Click here to enter BP  :1}***        Physical Exam:   VS:  There were no vitals taken for this visit.   Wt Readings from Last 3 Encounters:  07/30/24 185 lb (83.9 kg)  10/16/23 187 lb (84.8 kg)  09/25/23 180 lb (81.6 kg)    GEN: Well nourished, well developed in no acute  distress NECK: No JVD; No carotid bruits CARDIAC: ***RRR, no murmurs, rubs, gallops RESPIRATORY:  Clear to auscultation without rales, wheezing or rhonchi  ABDOMEN: Soft, non-tender, non-distended EXTREMITIES:  No edema; No acute deformity ***      Assessment and Plan:    Assessment and Plan Assessment & Plan      {Are you ordering a CV Procedure (e.g. stress test, cath, DCCV, TEE, etc)?   Press F2        :[id number]}  Dispo:  No follow-ups on file.  Signed, Lum LITTIE Louis, NP

## 2024-10-26 ENCOUNTER — Ambulatory Visit: Admitting: Physician Assistant

## 2024-11-16 ENCOUNTER — Other Ambulatory Visit: Payer: Self-pay

## 2024-11-16 ENCOUNTER — Encounter (HOSPITAL_BASED_OUTPATIENT_CLINIC_OR_DEPARTMENT_OTHER): Payer: Self-pay

## 2024-11-16 ENCOUNTER — Emergency Department (HOSPITAL_BASED_OUTPATIENT_CLINIC_OR_DEPARTMENT_OTHER)
Admission: EM | Admit: 2024-11-16 | Discharge: 2024-11-17 | Disposition: A | Payer: Self-pay | Attending: Emergency Medicine | Admitting: Emergency Medicine

## 2024-11-16 DIAGNOSIS — J018 Other acute sinusitis: Secondary | ICD-10-CM

## 2024-11-16 DIAGNOSIS — R202 Paresthesia of skin: Secondary | ICD-10-CM

## 2024-11-16 LAB — RESP PANEL BY RT-PCR (RSV, FLU A&B, COVID)  RVPGX2
Influenza A by PCR: NEGATIVE
Influenza B by PCR: NEGATIVE
Resp Syncytial Virus by PCR: NEGATIVE
SARS Coronavirus 2 by RT PCR: NEGATIVE

## 2024-11-16 NOTE — ED Triage Notes (Signed)
 Pt states that he has a cough, congestion and sinus headache.

## 2024-11-17 NOTE — ED Provider Notes (Signed)
 [place] EMERGENCY DEPARTMENT AT [facility] Provider Note   CSN: [id number] Arrival date & time: 11/16/24  2117     Patient presents with: Cough   Ricardo [name] is a 63 y.o. male.   The history is provided by the patient.  Patient with history of hypertension presents with cough and congestion.  Patient reports for the past week he has had sinus congestion and dry cough.  He reports clear nasal discharge.  No fevers or vomiting.  He reports mild headache, mild facial pain and ear pain.  No chest pain or shortness of breath.  No recent travel or known sick contacts.  Patient also mentions mild numbness on his left thigh but no weakness, no back pain and he is ambulatory    Past Medical History:  Diagnosis Date   Allergy    Shingles    Sinus congestion     Prior to Admission medications  Medication Sig Start Date End Date Taking? Authorizing Provider  albuterol  (VENTOLIN  HFA) 108 (90 Base) MCG/ACT inhaler Inhale 1-2 puffs into the lungs every 6 (six) hours as needed (cough). 07/05/22   Christia Budds, MD  aspirin  81 MG chewable tablet Chew 1 tablet (81 mg total) by mouth daily. 07/20/22   Neldon Inoue S, PA  COD LIVER OIL PO Take by mouth daily.    [provider]  fexofenadine  (ALLEGRA ) 60 MG tablet Take 1 tablet (60 mg total) by mouth 2 (two) times daily. 07/30/24   Doretha Folks, MD  losartan  (COZAAR ) 50 MG tablet Take 1 tablet (50 mg total) by mouth daily. NEED APPT PRIOR TO NEXT REFILL 09/14/24   Adele Song, MD  metoprolol  succinate (TOPROL -XL) 25 MG 24 hr tablet Take 1 tablet (25 mg total) by mouth daily. 06/11/24   Williams, Evan, PA-C  mometasone  (NASONEX ) 50 MCG/ACT nasal spray Place 2 sprays into the nose daily. 07/30/24   Doretha Folks, MD  Multiple Vitamin (MULTIVITAMIN) tablet Take 1 tablet by mouth daily.    [provider]  naproxen  sodium (ALEVE ) 220 MG tablet Take 220 mg by mouth daily as needed (pain).    [provider]  Omega-3 Fatty Acids (FISH OIL) 1000 MG CAPS Take by mouth.    [provider]    Allergies: Patient has no known allergies.    Review of Systems  Constitutional:  Negative for fever.  Respiratory:  Positive for cough. Negative for shortness of breath.   Cardiovascular:  Negative for chest pain.  Gastrointestinal:  Negative for vomiting.  Musculoskeletal:  Negative for back pain.  Neurological:  Negative for weakness.    Updated Vital Signs BP (!) 135/99   Pulse 66   Temp 98.2 F (36.8 C)   Resp 18   SpO2 96%   Physical Exam CONSTITUTIONAL: Well developed/well nourished HEAD: Normocephalic/atraumatic EYES: Patient wearing sunglasses ENMT: Mucous membranes moist Right TM occluded by cerumen.  Left TM clear and intact Mild nasal congestion is noted, no stridor, no drooling NECK: supple no meningeal signs CV: S1/S2 noted, no murmurs/rubs/gallops noted LUNGS: Lungs are clear to auscultation bilaterally, no apparent distress ABDOMEN: soft, nontender NEURO: Pt is awake/alert/appropriate, moves all extremitiesx4.  No facial droop.  Patient ambulates without difficulty equal distal motor 5/5 strength noted with the following: hip flexion/knee flexion/extension, ankle dorsi/plantar flexion, great toe extension intact bilaterally,  no sensory deficit in any dermatome.   EXTREMITIES: pulses normal/equal, full ROM Distal pulses equal and intact in both feet.  No calf tenderness, no  edema or erythema No tenderness noted to the left lower extremity SKIN: warm, color normal PSYCH: no abnormalities of mood noted, alert and oriented to situation  (all labs ordered are listed, but only abnormal results are displayed) Labs Reviewed  RESP PANEL BY RT-PCR (RSV, FLU A&B, COVID)  RVPGX2    EKG: None  Radiology: No results found.   Procedures   Medications Ordered in the ED - No data to display                                  Medical Decision  Making  Patient presents with nasal congestion and cough for the past 7 days.  He is afebrile, no acute distress, no added lung sounds.  At this point likely viral sinusitis.  No indication for antibiotics at this time.  Low suspicion for pneumonia at this time  Patient also mention vague numbness in his left thigh but denies any back pain or weakness. His clinical exam is overall unremarkable without any neurodeficits, no signs of any infectious etiology, no signs of any vascular issues Patient walks without difficulty  Patient safe for discharge     Final diagnoses:  Acute non-recurrent sinusitis of other sinus  Paresthesia    ED Discharge Orders     None          Midge Golas, MD 11/17/24 [phone]

## 2024-11-19 NOTE — Progress Notes (Deleted)
°  °  Cardiology Office Note Date:  11/19/2024  ID:  LAKSHYA MCGILLICUDDY, DOB 01-Jan-1961, MRN [id number] PCP:  Adele Song, MD  Cardiologist:   Joelle VEAR Ren Donley, MD  No chief complaint on file.     Problems Atypical Aflutter not on DOAC given score 1 TTE 9/23: 60-65% HTN M: LN50, XL25  Visits  12/25: HA1C, LP, CAC    History of Present Illness: Ricardo [name] is a 63 y.o. male who presents for follow up.   ROS: Please see the history of present illness. All other systems are reviewed and negative.   PHYSICAL EXAM: VS:  There were no vitals taken for this visit. , BMI There is no height or weight on file to calculate BMI. GEN: Well nourished, well developed, in no acute distress HEENT: normal Neck: no JVD, carotid bruits, or masses Cardiac: ***RRR; no murmurs, rubs, or gallops,no edema  Respiratory:  CTAB bilaterally, normal work of breathing GI: soft, nontender, nondistended, + BS Extremities: No LE edema Skin: warm and dry, no rash Neuro:  Strength and sensation are intact  Recent Labs: Reviewed  Studies: Reviewed  ASSESSMENT AND PLAN: [name] is a 63 y.o. male who presents for new visit.  - *** - *** - *** - ***   Signed, Joelle VEAR Ren Donley, MD  11/19/2024 12:58 PM    [facility]

## 2024-11-20 ENCOUNTER — Ambulatory Visit: Payer: Self-pay

## 2024-12-11 NOTE — Progress Notes (Unsigned)
"   °  °  Cardiology Office Note Date:  12/11/2024  ID:  Ricardo [name], DOB Nov 18, 1961, MRN [id number] PCP:  Adele Song, MD  Cardiologist:   Joelle VEAR Ren Donley, MD  No chief complaint on file.     Problems Atypical Aflutter not on DOAC given score 1 TTE 9/23: 60-65% HTN M: LN50, XL25  Visits  12/25: HA1C, LP, CAC    History of Present Illness: Ricardo [name] is a 64 y.o. male who presents for follow up.   ROS: Please see the history of present illness. All other systems are reviewed and negative.   PHYSICAL EXAM: VS:  There were no vitals taken for this visit. , BMI There is no height or weight on file to calculate BMI. GEN: Well nourished, well developed, in no acute distress HEENT: normal Neck: no JVD, carotid bruits, or masses Cardiac: ***RRR; no murmurs, rubs, or gallops,no edema  Respiratory:  CTAB bilaterally, normal work of breathing GI: soft, nontender, nondistended, + BS Extremities: No LE edema Skin: warm and dry, no rash Neuro:  Strength and sensation are intact  Recent Labs: Reviewed  Studies: Reviewed  ASSESSMENT AND PLAN: Ricardo [name] is a 64 y.o. male who presents for new visit.  - *** - *** - *** - ***   Signed, Joelle VEAR Ren Donley, MD  12/11/2024 11:33 AM    [facility] "

## 2024-12-14 ENCOUNTER — Ambulatory Visit: Payer: Self-pay

## 2024-12-14 DIAGNOSIS — I484 Atypical atrial flutter: Secondary | ICD-10-CM

## 2024-12-14 DIAGNOSIS — I10 Essential (primary) hypertension: Secondary | ICD-10-CM

## 2025-01-01 ENCOUNTER — Other Ambulatory Visit: Payer: Self-pay | Admitting: Family Medicine

## 2025-01-22 ENCOUNTER — Ambulatory Visit: Payer: Self-pay
